# Patient Record
Sex: Female | Born: 1960
Health system: Southern US, Community
[De-identification: ages and names within clinical notes are randomized; demographics above are authoritative.]

## PROBLEM LIST (undated history)

## (undated) DIAGNOSIS — G629 Polyneuropathy, unspecified: Secondary | ICD-10-CM

## (undated) DIAGNOSIS — F32A Depression, unspecified: Secondary | ICD-10-CM

## (undated) DIAGNOSIS — E785 Hyperlipidemia, unspecified: Secondary | ICD-10-CM

## (undated) DIAGNOSIS — F419 Anxiety disorder, unspecified: Secondary | ICD-10-CM

## (undated) DIAGNOSIS — G35 Multiple sclerosis: Secondary | ICD-10-CM

## (undated) HISTORY — DX: Polyneuropathy, unspecified: G62.9

## (undated) HISTORY — PX: OTHER SURGICAL HISTORY: SHX169

## (undated) HISTORY — DX: Multiple sclerosis: G35

## (undated) HISTORY — DX: Hyperlipidemia, unspecified: E78.5

## (undated) HISTORY — PX: KNEE SURGERY: SHX244

## (undated) HISTORY — DX: Depression, unspecified: F32.A

## (undated) HISTORY — DX: Anxiety disorder, unspecified: F41.9

---

## 2011-12-23 HISTORY — PX: COLONOSCOPY: SHX174

## 2014-12-22 HISTORY — PX: CARPAL TUNNEL WITH CUBITAL TUNNEL: SHX5608

## 2020-03-15 DIAGNOSIS — E782 Mixed hyperlipidemia: Secondary | ICD-10-CM | POA: Diagnosis not present

## 2020-03-15 DIAGNOSIS — D72829 Elevated white blood cell count, unspecified: Secondary | ICD-10-CM | POA: Diagnosis not present

## 2020-03-15 DIAGNOSIS — R7301 Impaired fasting glucose: Secondary | ICD-10-CM | POA: Diagnosis not present

## 2020-03-15 DIAGNOSIS — F17200 Nicotine dependence, unspecified, uncomplicated: Secondary | ICD-10-CM | POA: Diagnosis not present

## 2020-03-15 DIAGNOSIS — Z6824 Body mass index (BMI) 24.0-24.9, adult: Secondary | ICD-10-CM | POA: Diagnosis not present

## 2020-03-15 DIAGNOSIS — Z713 Dietary counseling and surveillance: Secondary | ICD-10-CM | POA: Diagnosis not present

## 2020-03-15 DIAGNOSIS — S0006XA Insect bite (nonvenomous) of scalp, initial encounter: Secondary | ICD-10-CM | POA: Diagnosis not present

## 2020-03-15 DIAGNOSIS — E7800 Pure hypercholesterolemia, unspecified: Secondary | ICD-10-CM | POA: Diagnosis not present

## 2020-03-20 DIAGNOSIS — G4700 Insomnia, unspecified: Secondary | ICD-10-CM | POA: Diagnosis not present

## 2020-03-20 DIAGNOSIS — E782 Mixed hyperlipidemia: Secondary | ICD-10-CM | POA: Diagnosis not present

## 2020-03-20 DIAGNOSIS — G35 Multiple sclerosis: Secondary | ICD-10-CM | POA: Diagnosis not present

## 2020-03-22 ENCOUNTER — Other Ambulatory Visit (HOSPITAL_COMMUNITY): Payer: Self-pay | Admitting: Internal Medicine

## 2020-03-22 DIAGNOSIS — Z1231 Encounter for screening mammogram for malignant neoplasm of breast: Secondary | ICD-10-CM

## 2020-03-22 DIAGNOSIS — Z13820 Encounter for screening for osteoporosis: Secondary | ICD-10-CM

## 2020-04-25 DIAGNOSIS — M5415 Radiculopathy, thoracolumbar region: Secondary | ICD-10-CM | POA: Diagnosis not present

## 2020-04-26 ENCOUNTER — Other Ambulatory Visit (HOSPITAL_COMMUNITY): Payer: Self-pay | Admitting: Internal Medicine

## 2020-04-26 ENCOUNTER — Other Ambulatory Visit: Payer: Self-pay | Admitting: Internal Medicine

## 2020-04-26 DIAGNOSIS — M5415 Radiculopathy, thoracolumbar region: Secondary | ICD-10-CM

## 2020-05-04 ENCOUNTER — Other Ambulatory Visit (HOSPITAL_COMMUNITY): Payer: Self-pay | Admitting: Internal Medicine

## 2020-05-04 ENCOUNTER — Other Ambulatory Visit: Payer: Self-pay | Admitting: Adult Health Nurse Practitioner

## 2020-05-04 ENCOUNTER — Other Ambulatory Visit (HOSPITAL_COMMUNITY): Payer: Self-pay | Admitting: Adult Health Nurse Practitioner

## 2020-05-04 DIAGNOSIS — M545 Low back pain, unspecified: Secondary | ICD-10-CM

## 2020-05-04 DIAGNOSIS — M5415 Radiculopathy, thoracolumbar region: Secondary | ICD-10-CM

## 2020-05-05 ENCOUNTER — Ambulatory Visit (HOSPITAL_BASED_OUTPATIENT_CLINIC_OR_DEPARTMENT_OTHER): Payer: Medicare Other

## 2020-05-05 ENCOUNTER — Encounter (HOSPITAL_BASED_OUTPATIENT_CLINIC_OR_DEPARTMENT_OTHER): Payer: Self-pay

## 2020-05-18 ENCOUNTER — Ambulatory Visit (HOSPITAL_COMMUNITY)
Admission: RE | Admit: 2020-05-18 | Discharge: 2020-05-18 | Disposition: A | Discharge status: Home / Self Care | Payer: Medicare Other | Source: Ambulatory Visit | Attending: Internal Medicine | Admitting: Internal Medicine

## 2020-05-18 ENCOUNTER — Other Ambulatory Visit: Payer: Self-pay

## 2020-05-18 DIAGNOSIS — M545 Low back pain, unspecified: Secondary | ICD-10-CM

## 2020-05-18 DIAGNOSIS — M5415 Radiculopathy, thoracolumbar region: Secondary | ICD-10-CM | POA: Diagnosis not present

## 2020-05-18 DIAGNOSIS — M47814 Spondylosis without myelopathy or radiculopathy, thoracic region: Secondary | ICD-10-CM | POA: Diagnosis not present

## 2020-05-18 DIAGNOSIS — R079 Chest pain, unspecified: Secondary | ICD-10-CM | POA: Diagnosis not present
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
## 2020-05-18 IMAGING — MR MR CHEST MEDIASTINUM W/O CM
7 series · 16 of 16 positions shown · non-contrast
Comparison: None.

CLINICAL DATA: Left-sided posterior chest pain.

EXAM:
MRI CHEST WITHOUT CONTRAST
TECHNIQUE: Multiplanar, multiecho pulse sequences of the left posterior thorax
was performed without intravenous contrast.

[Series 4: T1 · axial · 3.0mm · 0.70mm/px · z∈[-148,+7]mm · 3 of 44 slices shown]
[im 1/44]
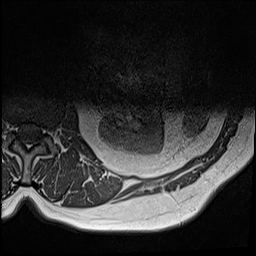
[im 22/44]
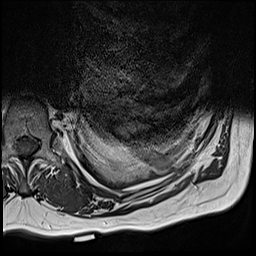
[im 44/44]
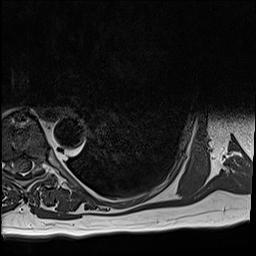

[Series 5: T1 fat-sat · axial · 3.0mm · 0.56mm/px · z∈[-148,+7]mm · 3 of 44 slices shown (1 of 2)]
[im 1/44]
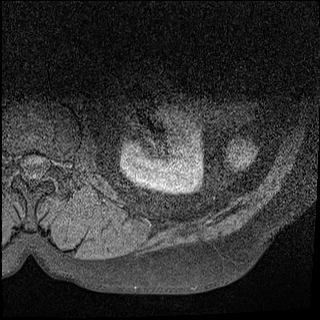
[im 22/44]
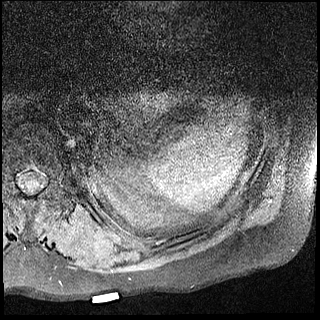
[im 44/44]
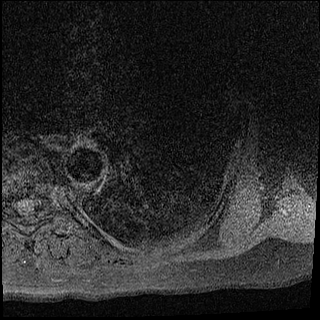

[Series 8: T1 fat-sat · coronal · 4.0mm · 0.56mm/px · 1 of 21 slices shown (2 of 2)]
[im 1/21]
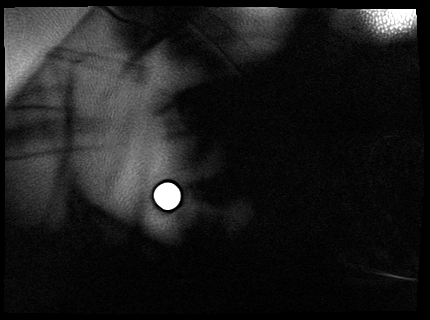

[Series 9: T2 fat-sat · coronal · 4.0mm · 0.35mm/px · 1 of 21 slices shown (1 of 2)]
[im 1/21]
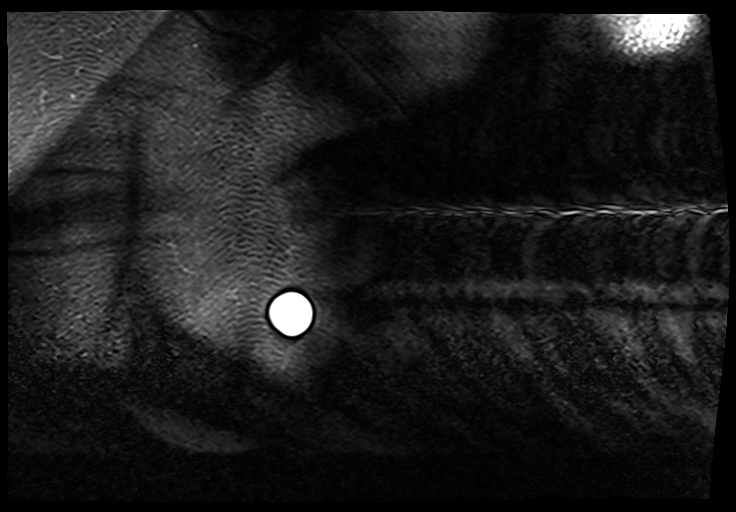

[Series 10: STIR · coronal · 3.0mm · 0.86mm/px · 2 of 26 slices shown]
[im 1/26]
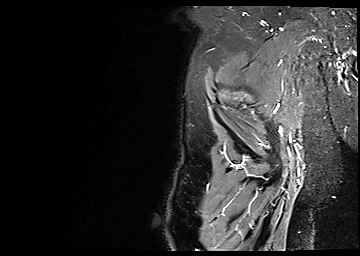
[im 26/26]
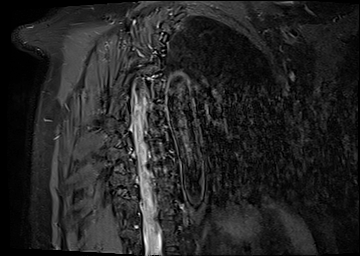

[Series 11: T2 fat-sat · axial · 3.0mm · 0.35mm/px · z∈[-148,+7]mm · 3 of 44 slices shown (2 of 2)]
[im 1/44]
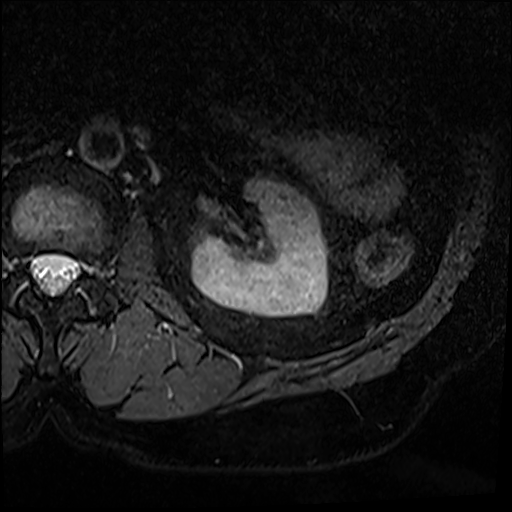
[im 22/44]
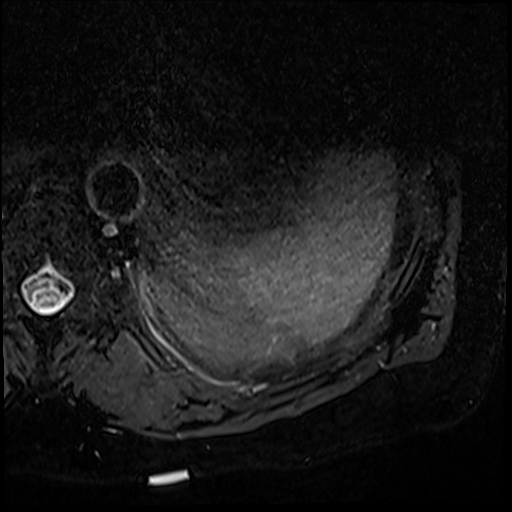
[im 44/44]
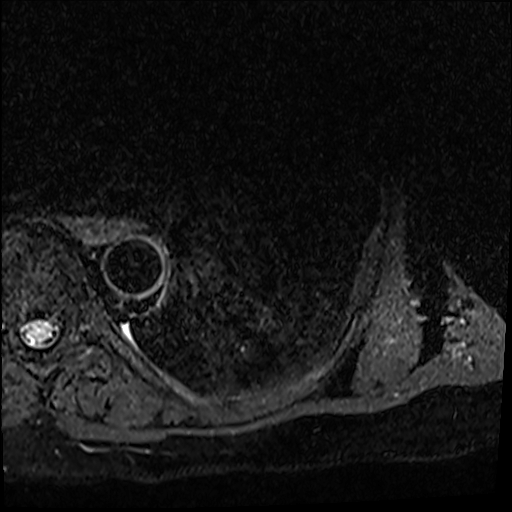

[Series 100: T2 · axial · 3.0mm · 0.56mm/px · z∈[-148,+7]mm · 3 of 44 slices shown]
[im 1/44]
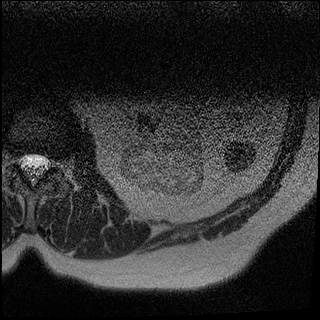
[im 22/44]
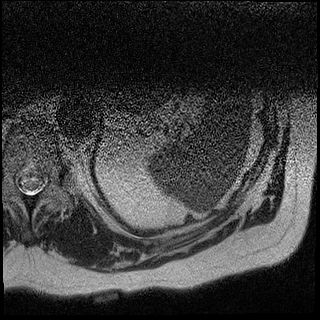
[im 44/44]
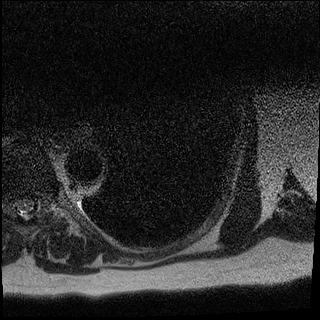

[16 of 16 positions shown; findings below may reference images not displayed]

FINDINGS: Bones/Joint/Cartilage

No marrow signal abnormality. No fracture or dislocation. Normal
alignment. No joint effusion.

Multiple old healed left posterior rib fractures.

Mild degenerative disc disease with disc height loss of the
midthoracic spine most severe at T8-9.

Ligaments, Muscles and Tendons
Muscles are normal.

Soft tissue
No fluid collection or hematoma.  No soft tissue mass.
IMPRESSION: 1. No acute abnormality of the left posterior chest.
2. Multiple old healed left posterior rib fractures.
3. Mild degenerative disc disease with disc height loss of the
midthoracic spine most severe at T8-9.
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
## 2020-05-24 DIAGNOSIS — J209 Acute bronchitis, unspecified: Secondary | ICD-10-CM | POA: Diagnosis not present

## 2020-09-05 DIAGNOSIS — M25562 Pain in left knee: Secondary | ICD-10-CM | POA: Diagnosis not present

## 2020-09-05 DIAGNOSIS — J309 Allergic rhinitis, unspecified: Secondary | ICD-10-CM | POA: Diagnosis not present

## 2020-09-05 DIAGNOSIS — M545 Low back pain: Secondary | ICD-10-CM | POA: Diagnosis not present

## 2020-09-05 DIAGNOSIS — R7301 Impaired fasting glucose: Secondary | ICD-10-CM | POA: Diagnosis not present

## 2020-09-12 DIAGNOSIS — J309 Allergic rhinitis, unspecified: Secondary | ICD-10-CM | POA: Diagnosis not present

## 2020-09-12 DIAGNOSIS — M25562 Pain in left knee: Secondary | ICD-10-CM | POA: Diagnosis not present

## 2020-09-12 DIAGNOSIS — Z0001 Encounter for general adult medical examination with abnormal findings: Secondary | ICD-10-CM | POA: Diagnosis not present

## 2020-09-12 DIAGNOSIS — M545 Low back pain: Secondary | ICD-10-CM | POA: Diagnosis not present

## 2020-10-08 ENCOUNTER — Encounter: Payer: Self-pay | Admitting: Internal Medicine

## 2020-10-22 DIAGNOSIS — H5203 Hypermetropia, bilateral: Secondary | ICD-10-CM | POA: Diagnosis not present

## 2020-10-22 DIAGNOSIS — H524 Presbyopia: Secondary | ICD-10-CM | POA: Diagnosis not present

## 2020-11-06 ENCOUNTER — Ambulatory Visit: Payer: Medicare Other | Admitting: Gastroenterology

## 2020-11-06 ENCOUNTER — Other Ambulatory Visit: Payer: Self-pay

## 2020-11-06 ENCOUNTER — Encounter: Payer: Self-pay | Admitting: Gastroenterology

## 2020-11-06 DIAGNOSIS — Z1211 Encounter for screening for malignant neoplasm of colon: Secondary | ICD-10-CM

## 2020-11-06 NOTE — Progress Notes (Signed)
Primary Care Physician:  Celene Squibb, MD  Primary Gastroenterologist:  Garfield Cornea, MD   Chief Complaint  Patient presents with  . Colonoscopy    last tcs in 2013-2014 in [place]; not having any problems    HPI:  Stacey Sawyer is a 59 y.o. female here at the request of Dr. Nevada Crane for colonoscopy.   Patient states she had colonoscopy in 2013 in [place]. She does not recall results. Her PCP at the time was Dr. Eilene Ghazi. She had colonoscopy in the [facility] system.   From gi standpoint she is doing good. BM regular. No melena, brbpr. No weight loss. No abdominal pain. Occasional heartburn. No n/v.    Current Outpatient Medications  Medication Sig Dispense Refill  . Biotin w/ Vitamins C & E (HAIR/SKIN/NAILS PO) Take 2 tablets by mouth daily.    . cyclobenzaprine (FLEXERIL) 10 MG tablet Take 10 mg by mouth daily.    Marland Kitchen escitalopram (LEXAPRO) 10 MG tablet Take 10 mg by mouth at bedtime.     . ferrous sulfate 325 (65 FE) MG tablet Take 1 tablet by mouth every other day.    . gabapentin (NEURONTIN) 600 MG tablet Take 1,200 mg by mouth 2 (two) times daily.    Marland Kitchen OVER THE COUNTER MEDICATION Calcium Magnesium Zinc Vitamin D one tablet by mouth every other day    . simvastatin (ZOCOR) 20 MG tablet Take 20 mg by mouth at bedtime.    . Teriflunomide (AUBAGIO) 14 MG TABS Take 1 tablet by mouth daily.    . traZODone (DESYREL) 50 MG tablet Take 50 mg by mouth at bedtime.     No current facility-administered medications for this visit.    Allergies as of 11/06/2020 - Review Complete 11/06/2020  Allergen Reaction Noted  . Codeine Itching 11/06/2020    Past Medical History:  Diagnosis Date  . Anxiety   . Depression   . Dyslipidemia   . MS (multiple sclerosis) ([place])   . Neuropathy     Past Surgical History:  Procedure Laterality Date  . c section     3  . CARPAL TUNNEL WITH CUBITAL TUNNEL Bilateral 2016  . COLONOSCOPY  2013   patient does not recall results  . KNEE  SURGERY Left    twice    Family History  Problem Relation Age of Onset  . Dementia Mother        deceased age 45  . Bipolar disorder Mother   . Other Father        died at age 65  . Colon cancer Neg Hx   . Colon polyps Neg Hx     Social History   Socioeconomic History  . Marital status: Married    Spouse name: Not on file  . Number of children: Not on file  . Years of education: Not on file  . Highest education level: Not on file  Occupational History  . Not on file  Tobacco Use  . Smoking status: Current Every Day Smoker    Types: Cigarettes  . Smokeless tobacco: Never Used  Substance and Sexual Activity  . Alcohol use: Not Currently  . Drug use: Never  . Sexual activity: Not on file  Other Topics Concern  . Not on file  Social History Narrative  . Not on file   Social Determinants of Health   Financial Resource Strain:   . Difficulty of Paying Living Expenses: Not on file  Food Insecurity:   . Worried About  Running Out of Food in the Last Year: Not on file  . Ran Out of Food in the Last Year: Not on file  Transportation Needs:   . Lack of Transportation (Medical): Not on file  . Lack of Transportation (Non-Medical): Not on file  Physical Activity:   . Days of Exercise per Week: Not on file  . Minutes of Exercise per Session: Not on file  Stress:   . Feeling of Stress : Not on file  Social Connections:   . Frequency of Communication with Friends and Family: Not on file  . Frequency of Social Gatherings with Friends and Family: Not on file  . Attends Religious Services: Not on file  . Active Member of Clubs or Organizations: Not on file  . Attends Archivist Meetings: Not on file  . Marital Status: Not on file  Intimate Partner Violence:   . Fear of Current or Ex-Partner: Not on file  . Emotionally Abused: Not on file  . Physically Abused: Not on file  . Sexually Abused: Not on file      ROS:  General: Negative for anorexia, weight  loss, fever, chills, fatigue, +weakness at times with MS Eyes: Negative for vision changes.  ENT: Negative for hoarseness, difficulty swallowing , nasal congestion. CV: Negative for chest pain, angina, palpitations, dyspnea on exertion, peripheral edema.  Respiratory: Negative for dyspnea at rest, dyspnea on exertion, cough, sputum, wheezing.  GI: See history of present illness. GU:  Negative for dysuria, hematuria, urinary incontinence, urinary frequency, nocturnal urination.  MS: Negative for joint pain, low back pain.  Derm: Negative for rash or itching.  Neuro: Negative for weakness, abnormal sensation, seizure, frequent headaches, memory loss, confusion.  Psych: Negative for anxiety, depression, suicidal ideation, hallucinations.  Endo: Negative for unusual weight change.  Heme: Negative for bruising or bleeding. Allergy: Negative for rash or hives.    Physical Examination:  BP (!) 151/92   Pulse 84   Temp (!) 97.1 F (36.2 C) (Temporal)   Ht '5\' 3"'  (1.6 m)   Wt 153 lb 6.4 oz (69.6 kg)   BMI 27.17 kg/m    General: Well-nourished, well-developed in no acute distress. Accompanied by spouse Head: Normocephalic, atraumatic.   Eyes: Conjunctiva pink, no icterus. Mouth:negative. Neck: Supple without thyromegaly, masses, or lymphadenopathy.  Lungs: Clear to auscultation bilaterally.  Heart: Regular rate and rhythm, no murmurs rubs or gallops.  Abdomen: Bowel sounds are normal, nontender, nondistended, no hepatosplenomegaly or masses, no abdominal bruits or    hernia , no rebound or guarding.   Rectal: not performed Extremities: No lower extremity edema. No clubbing or deformities.  Neuro: Alert and oriented x 4 , grossly normal neurologically.  Skin: Warm and dry, no rash or jaundice.   Psych: Alert and cooperative, normal mood and affect.  Labs: 09/05/2020: White blood cell count 6700, hemoglobin 13.4, platelets.  229,000, creatinine 0.9, total bilirubin 0.3, alk phos 80, AST  19, ALT 21, A1c 5.6  Imaging Studies: No results found.  Impression/Plan  59 y/o female with h/o MS, presenting to schedule colonoscopy. Her last one was in 2013, by Dr. Thurmond Butts [facility]. She is unaware of results. No FH of CRC or colon polyps to her knowledge. She denies current GI concerns. We will request records to determine timing of next colonoscopy.

## 2020-11-06 NOTE — Patient Instructions (Signed)
We will request your records and make further recommendations regarding colonoscopy.

## 2020-11-14 ENCOUNTER — Telehealth: Payer: Self-pay | Admitting: Gastroenterology

## 2020-11-14 DIAGNOSIS — Z79899 Other long term (current) drug therapy: Secondary | ICD-10-CM | POA: Diagnosis not present

## 2020-11-14 DIAGNOSIS — Z5181 Encounter for therapeutic drug level monitoring: Secondary | ICD-10-CM | POA: Diagnosis not present

## 2020-11-14 DIAGNOSIS — G35 Multiple sclerosis: Secondary | ICD-10-CM | POA: Diagnosis not present

## 2020-11-14 NOTE — Telephone Encounter (Signed)
Colonoscopy dated December 10, 2012 completed at [facility] gastroenterology specialist in [place] -Nonthrombosed external hemorrhoids found on perianal exam. -two 2 to 4 mm polyps in the rectum -one 8 mm polyp in the rectum -Internal hemorrhoids -Pathology revealed hyperplastic polyps   Dr. Gala Romney: patient presented at request of PCP for screening colonoscopy. Based on above 2013 results do you advised colonoscopy now or in 2023?

## 2020-11-18 NOTE — Telephone Encounter (Signed)
Now (2022) close enough.

## 2020-11-19 NOTE — Telephone Encounter (Signed)
Ok to schedule colonoscopy with RMR with propofol. ASA II.

## 2020-11-19 NOTE — Telephone Encounter (Signed)
LMOVM

## 2020-11-20 NOTE — Telephone Encounter (Signed)
LMOVM for pt 

## 2020-11-22 NOTE — Telephone Encounter (Signed)
LETTER MAILED TO PATIENT

## 2020-11-23 NOTE — Telephone Encounter (Signed)
Pt returned call. She has been scheduled for 1/27 at 12:00pm. covid scheduled for 1/26 at 10:00am. Aware will mail all instructions to her. Confirmed mailing address.

## 2020-11-28 ENCOUNTER — Telehealth: Payer: Self-pay | Admitting: Internal Medicine

## 2020-11-28 NOTE — Telephone Encounter (Signed)
Noted. Patient instructions would not have went out until Monday

## 2020-11-28 NOTE — Telephone Encounter (Signed)
Pt called to say she received a letter from [place] that we were unable to reach her by phone to schedule her colonoscopy. She said she was already scheduled a procedure with Dr Gala Romney on 01/17/2021. I told her to disregard the letter. She said that she has not received any instructions for the prep and wants the prep called into [facility] and doesn't want to pay for anything. [phone] (I printed off the instructions and they will go out in the mail tomorrow)

## 2020-12-04 ENCOUNTER — Telehealth: Payer: Self-pay | Admitting: *Deleted

## 2020-12-04 NOTE — Telephone Encounter (Signed)
Called pt and she was agreeable to moving procedure up on 1/27 to 10:00am. Aware will mail new prep instructions. Confirmed mailing address.

## 2021-01-16 ENCOUNTER — Other Ambulatory Visit (HOSPITAL_COMMUNITY)
Admission: RE | Admit: 2021-01-16 | Discharge: 2021-01-16 | Disposition: A | Discharge status: Home / Self Care | Payer: Medicare Other | Source: Ambulatory Visit | Attending: Internal Medicine | Admitting: Internal Medicine

## 2021-01-16 ENCOUNTER — Other Ambulatory Visit: Payer: Self-pay

## 2021-01-16 DIAGNOSIS — Z01812 Encounter for preprocedural laboratory examination: Secondary | ICD-10-CM | POA: Insufficient documentation

## 2021-01-16 DIAGNOSIS — Z20822 Contact with and (suspected) exposure to covid-19: Secondary | ICD-10-CM | POA: Insufficient documentation

## 2021-01-16 LAB — SARS CORONAVIRUS 2 (TAT 6-24 HRS): SARS Coronavirus 2: NEGATIVE

## 2021-01-17 ENCOUNTER — Encounter (HOSPITAL_COMMUNITY)
Admission: RE | Disposition: A | Discharge status: Home / Self Care | Payer: Self-pay | Source: Home / Self Care | Attending: Internal Medicine

## 2021-01-17 ENCOUNTER — Ambulatory Visit (HOSPITAL_COMMUNITY): Payer: Medicare Other | Admitting: Certified Registered Nurse Anesthetist

## 2021-01-17 ENCOUNTER — Other Ambulatory Visit: Payer: Self-pay

## 2021-01-17 ENCOUNTER — Encounter (HOSPITAL_COMMUNITY): Payer: Self-pay | Admitting: Internal Medicine

## 2021-01-17 ENCOUNTER — Ambulatory Visit (HOSPITAL_COMMUNITY)
Admission: RE | Admit: 2021-01-17 | Discharge: 2021-01-17 | Disposition: A | Discharge status: Home / Self Care | Payer: Medicare Other | Attending: Internal Medicine | Admitting: Internal Medicine

## 2021-01-17 DIAGNOSIS — Z1211 Encounter for screening for malignant neoplasm of colon: Secondary | ICD-10-CM

## 2021-01-17 DIAGNOSIS — F17210 Nicotine dependence, cigarettes, uncomplicated: Secondary | ICD-10-CM | POA: Insufficient documentation

## 2021-01-17 DIAGNOSIS — D123 Benign neoplasm of transverse colon: Secondary | ICD-10-CM | POA: Insufficient documentation

## 2021-01-17 DIAGNOSIS — K5730 Diverticulosis of large intestine without perforation or abscess without bleeding: Secondary | ICD-10-CM | POA: Diagnosis not present

## 2021-01-17 DIAGNOSIS — K635 Polyp of colon: Secondary | ICD-10-CM | POA: Diagnosis not present

## 2021-01-17 DIAGNOSIS — G35 Multiple sclerosis: Secondary | ICD-10-CM | POA: Diagnosis not present

## 2021-01-17 DIAGNOSIS — Z79899 Other long term (current) drug therapy: Secondary | ICD-10-CM | POA: Diagnosis not present

## 2021-01-17 HISTORY — PX: POLYPECTOMY: SHX5525

## 2021-01-17 HISTORY — PX: COLONOSCOPY WITH PROPOFOL: SHX5780

## 2021-01-17 SURGERY — COLONOSCOPY WITH PROPOFOL
Anesthesia: General

## 2021-01-17 MED ORDER — PROPOFOL 500 MG/50ML IV EMUL
INTRAVENOUS | Status: DC | PRN
  Administered 2021-01-17: 125 ug/kg/min via INTRAVENOUS

## 2021-01-17 MED ORDER — PROPOFOL 10 MG/ML IV BOLUS
INTRAVENOUS | Status: DC | PRN
  Administered 2021-01-17: 100 mg via INTRAVENOUS

## 2021-01-17 MED ORDER — LACTATED RINGERS IV SOLN: INTRAVENOUS | Status: DC

## 2021-01-17 NOTE — Anesthesia Postprocedure Evaluation (Signed)
Anesthesia Post Note  Patient: [name]  Procedure(s) Performed: COLONOSCOPY WITH PROPOFOL (N/A ) POLYPECTOMY  Patient location during evaluation: Phase II Anesthesia Type: General Level of consciousness: awake and oriented Pain management: satisfactory to patient Vital Signs Assessment: post-procedure vital signs reviewed and stable Respiratory status: spontaneous breathing and respiratory function stable Cardiovascular status: blood pressure returned to baseline Postop Assessment: no apparent nausea or vomiting Anesthetic complications: no   No complications documented.   Last Vitals:  Vitals:   01/17/21 0826  BP: (!) 160/89  Resp: 20  Temp: 36.9 C  SpO2: 97%    Last Pain:  Vitals:   01/17/21 1002  TempSrc:   PainSc: 0-No pain                 Karna Dupes

## 2021-01-17 NOTE — Discharge Instructions (Signed)
Colonoscopy Discharge Instructions  Read the instructions outlined below and refer to this sheet in the next few weeks. These discharge instructions provide you with general information on caring for yourself after you leave the hospital. Your doctor may also give you specific instructions. While your treatment has been planned according to the most current medical practices available, unavoidable complications occasionally occur. If you have any problems or questions after discharge, call Dr. Gala Romney at [phone]. ACTIVITY  You may resume your regular activity, but move at a slower pace for the next 24 hours.   Take frequent rest periods for the next 24 hours.   Walking will help get rid of the air and reduce the bloated feeling in your belly (abdomen).   No driving for 24 hours (because of the medicine (anesthesia) used during the test).    Do not sign any important legal documents or operate any machinery for 24 hours (because of the anesthesia used during the test).  NUTRITION  Drink plenty of fluids.   You may resume your normal diet as instructed by your doctor.   Begin with a light meal and progress to your normal diet. Heavy or fried foods are harder to digest and may make you feel sick to your stomach (nauseated).   Avoid alcoholic beverages for 24 hours or as instructed.  MEDICATIONS  You may resume your normal medications unless your doctor tells you otherwise.  WHAT YOU CAN EXPECT TODAY  Some feelings of bloating in the abdomen.   Passage of more gas than usual.   Spotting of blood in your stool or on the toilet paper.  IF YOU HAD POLYPS REMOVED DURING THE COLONOSCOPY:  No aspirin products for 7 days or as instructed.   No alcohol for 7 days or as instructed.   Eat a soft diet for the next 24 hours.  FINDING OUT THE RESULTS OF YOUR TEST Not all test results are available during your visit. If your test results are not back during the visit, make an appointment  with your caregiver to find out the results. Do not assume everything is normal if you have not heard from your caregiver or the medical facility. It is important for you to follow up on all of your test results.  SEEK IMMEDIATE MEDICAL ATTENTION IF:  You have more than a spotting of blood in your stool.   Your belly is swollen (abdominal distention).   You are nauseated or vomiting.   You have a temperature over 101.   You have abdominal pain or discomfort that is severe or gets worse throughout the day.   2 polyps removed in your colon today  Diverticulosis and colon polyp information provided  Further recommendations to follow pending review of pathology report  At patient request, I called Venancio Poisson at [phone] -rolled to voicemail.  Left a message.   Diverticulosis  Diverticulosis is a condition that develops when small pouches (diverticula) form in the wall of the large intestine (colon). The colon is where water is absorbed and stool (feces) is formed. The pouches form when the inside layer of the colon pushes through weak spots in the outer layers of the colon. You may have a few pouches or many of them. The pouches usually do not cause problems unless they become inflamed or infected. When this happens, the condition is called diverticulitis. What are the causes? The cause of this condition is not known. What increases the risk? The following factors may make you more  likely to develop this condition:  Being older than age 45. Your risk for this condition increases with age. Diverticulosis is rare among people younger than age 44. By age 57, many people have it.  Eating a low-fiber diet.  Having frequent constipation.  Being overweight.  Not getting enough exercise.  Smoking.  Taking over-the-counter pain medicines, like aspirin and ibuprofen.  Having a family history of diverticulosis. What are the signs or symptoms? In most people, there are no  symptoms of this condition. If you do have symptoms, they may include:  Bloating.  Cramps in the abdomen.  Constipation or diarrhea.  Pain in the lower left side of the abdomen. How is this diagnosed? Because diverticulosis usually has no symptoms, it is most often diagnosed during an exam for other colon problems. The condition may be diagnosed by:  Using a flexible scope to examine the colon (colonoscopy).  Taking an X-ray of the colon after dye has been put into the colon (barium enema).  Having a CT scan. How is this treated? You may not need treatment for this condition. Your health care provider may recommend treatment to prevent problems. You may need treatment if you have symptoms or if you previously had diverticulitis. Treatment may include:  Eating a high-fiber diet.  Taking a fiber supplement.  Taking a live bacteria supplement (probiotic).  Taking medicine to relax your colon.   Follow these instructions at home: Medicines  Take over-the-counter and prescription medicines only as told by your health care provider.  If told by your health care provider, take a fiber supplement or probiotic. Constipation prevention Your condition may cause constipation. To prevent or treat constipation, you may need to:  Drink enough fluid to keep your urine pale yellow.  Take over-the-counter or prescription medicines.  Eat foods that are high in fiber, such as beans, whole grains, and fresh fruits and vegetables.  Limit foods that are high in fat and processed sugars, such as fried or sweet foods.   General instructions  Try not to strain when you have a bowel movement.  Keep all follow-up visits as told by your health care provider. This is important. Contact a health care provider if you:  Have pain in your abdomen.  Have bloating.  Have cramps.  Have not had a bowel movement in 3 days. Get help right away if:  Your pain gets worse.  Your bloating becomes  very bad.  You have a fever or chills, and your symptoms suddenly get worse.  You vomit.  You have bowel movements that are bloody or black.  You have bleeding from your rectum. Summary  Diverticulosis is a condition that develops when small pouches (diverticula) form in the wall of the large intestine (colon).  You may have a few pouches or many of them.  This condition is most often diagnosed during an exam for other colon problems.  Treatment may include increasing the fiber in your diet, taking supplements, or taking medicines. This information is not intended to replace advice given to you by your health care provider. Make sure you discuss any questions you have with your health care provider. Document Revised: 07/07/2019 Document Reviewed: 07/07/2019 Elsevier Patient Education  [place].  Colon Polyps  Colon polyps are tissue growths inside the colon, which is part of the large intestine. They are one of the types of polyps that can grow in the body. A polyp may be a round bump or a mushroom-shaped growth. You could  have one polyp or more than one. Most colon polyps are noncancerous (benign). However, some colon polyps can become cancerous over time. Finding and removing the polyps early can help prevent this. What are the causes? The exact cause of colon polyps is not known. What increases the risk? The following factors may make you more likely to develop this condition:  Having a family history of colorectal cancer or colon polyps.  Being older than 59 years of age.  Being younger than 59 years of age and having a significant family history of colorectal cancer or colon polyps or a genetic condition that puts you at higher risk of getting colon polyps.  Having inflammatory bowel disease, such as ulcerative colitis or Crohn's disease.  Having certain conditions passed from parent to child (hereditary conditions), such as: ? Familial adenomatous polyposis  (FAP). ? Lynch syndrome. ? Turcot syndrome. ? Peutz-Jeghers syndrome. ? MUTYH-associated polyposis (MAP).  Being overweight.  Certain lifestyle factors. These include smoking cigarettes, drinking too much alcohol, not getting enough exercise, and eating a diet that is high in fat and red meat and low in fiber.  Having had childhood cancer that was treated with radiation of the abdomen. What are the signs or symptoms? Many times, there are no symptoms. If you have symptoms, they may include:  Blood coming from the rectum during a bowel movement.  Blood in the stool (feces). The blood may be bright red or very dark in color.  Pain in the abdomen.  A change in bowel habits, such as constipation or diarrhea. How is this diagnosed? This condition is diagnosed with a colonoscopy. This is a procedure in which a lighted, flexible scope is inserted into the opening between the buttocks (anus) and then passed into the colon to examine the area. Polyps are sometimes found when a colonoscopy is done as part of routine cancer screening tests. How is this treated? This condition is treated by removing any polyps that are found. Most polyps can be removed during a colonoscopy. Those polyps will then be tested for cancer. Additional treatment may be needed depending on the results of testing. Follow these instructions at home: Eating and drinking  Eat foods that are high in fiber, such as fruits, vegetables, and whole grains.  Eat foods that are high in calcium and vitamin D, such as milk, cheese, yogurt, eggs, liver, fish, and broccoli.  Limit foods that are high in fat, such as fried foods and desserts.  Limit the amount of red meat, precooked or cured meat, or other processed meat that you eat, such as hot dogs, sausages, bacon, or meat loaves.  Limit sugary drinks.   Lifestyle  Maintain a healthy weight, or lose weight if recommended by your health care provider.  Exercise every day or  as told by your health care provider.  Do not use any products that contain nicotine or tobacco, such as cigarettes, e-cigarettes, and chewing tobacco. If you need help quitting, ask your health care provider.  Do not drink alcohol if: ? Your health care provider tells you not to drink. ? You are pregnant, may be pregnant, or are planning to become pregnant.  If you drink alcohol: ? Limit how much you use to:  0-1 drink a day for women.  0-2 drinks a day for men. ? Know how much alcohol is in your drink. In the [place] one drink equals one 12 oz bottle of beer (355 mL), one 5 oz glass of wine (148 mL),  or one 1 oz glass of hard liquor (44 mL). General instructions  Take over-the-counter and prescription medicines only as told by your health care provider.  Keep all follow-up visits. This is important. This includes having regularly scheduled colonoscopies. Talk to your health care provider about when you need a colonoscopy. Contact a health care provider if:  You have new or worsening bleeding during a bowel movement.  You have new or increased blood in your stool.  You have a change in bowel habits.  You lose weight for no known reason. Summary  Colon polyps are tissue growths inside the colon, which is part of the large intestine. They are one type of polyp that can grow in the body.  Most colon polyps are noncancerous (benign), but some can become cancerous over time.  This condition is diagnosed with a colonoscopy.  This condition is treated by removing any polyps that are found. Most polyps can be removed during a colonoscopy. This information is not intended to replace advice given to you by your health care provider. Make sure you discuss any questions you have with your health care provider. Document Revised: 03/28/2020 Document Reviewed: 03/28/2020 Elsevier Patient Education  2021 [employer].

## 2021-01-17 NOTE — Transfer of Care (Signed)
Immediate Anesthesia Transfer of Care Note  Patient: Stacey Sawyer  Procedure(s) Performed: COLONOSCOPY WITH PROPOFOL (N/A ) POLYPECTOMY  Patient Location: PACU  Anesthesia Type:General  Level of Consciousness: awake and alert   Airway & Oxygen Therapy: Patient Spontanous Breathing  Post-op Assessment: Report given to RN and Post -op Vital signs reviewed and stable  Post vital signs: Reviewed and stable  Last Vitals:  Vitals Value Taken Time  BP    Temp    Pulse    Resp    SpO2      Last Pain:  Vitals:   01/17/21 1002  TempSrc:   PainSc: 0-No pain      Patients Stated Pain Goal: 10 (37/48/27 0786)  Complications: No complications documented.

## 2021-01-17 NOTE — H&P (Signed)
@LOGO @   Primary Care Physician:  Celene Squibb, MD Primary Gastroenterologist:  Dr. Gala Romney  Pre-Procedure History & Physical: HPI:  Stacey Sawyer is a 60 y.o. female is here for a screening colonoscopy. Negative colonoscopy in [place] 10 years ago. No GI symptoms currently.  Past Medical History:  Diagnosis Date  . Anxiety   . Depression   . Dyslipidemia   . MS (multiple sclerosis) ([place])   . Neuropathy     Past Surgical History:  Procedure Laterality Date  . c section     3  . CARPAL TUNNEL WITH CUBITAL TUNNEL Bilateral 2016  . COLONOSCOPY  2013   patient does not recall results  . KNEE SURGERY Left    twice    Prior to Admission medications   Medication Sig Start Date End Date Taking? Authorizing Provider  Biotin w/ Vitamins C & E (HAIR/SKIN/NAILS PO) Take 2 tablets by mouth 2 (two) times a week. gummie   Yes [provider]  cyclobenzaprine (FLEXERIL) 10 MG tablet Take 10 mg by mouth 3 (three) times daily.   Yes [provider]  escitalopram (LEXAPRO) 10 MG tablet Take 10 mg by mouth at bedtime.    Yes [provider]  ferrous sulfate 325 (65 FE) MG tablet Take 325 mg by mouth every other day.   Yes [provider]  gabapentin (NEURONTIN) 600 MG tablet Take 1,200 mg by mouth 2 (two) times daily. 10/19/20  Yes [provider]  OVER THE COUNTER MEDICATION Take 1 tablet by mouth every other day. Calcium Magnesium Zinc Vitamin D   Yes [provider]  simvastatin (ZOCOR) 20 MG tablet Take 20 mg by mouth at bedtime. 10/19/20  Yes [provider]  Teriflunomide (AUBAGIO) 14 MG TABS Take 14 mg by mouth daily.   Yes [provider]  traZODone (DESYREL) 50 MG tablet Take 50 mg by mouth at bedtime. 08/23/20  Yes [provider]  diazepam (VALIUM) 5 MG tablet Take 5 mg by mouth daily as needed for anxiety or sedation. 12/17/20   [provider]  oxyCODONE (OXY IR/ROXICODONE) 5 MG immediate  release tablet Take 5 mg by mouth daily as needed for pain. 09/02/14   [provider]    Allergies as of 11/23/2020 - Review Complete 11/06/2020  Allergen Reaction Noted  . Codeine Itching 11/06/2020    Family History  Problem Relation Age of Onset  . Dementia Mother        deceased age 74  . Bipolar disorder Mother   . Other Father        died at age 46  . Colon cancer Neg Hx   . Colon polyps Neg Hx     Social History   Socioeconomic History  . Marital status: Married    Spouse name: Not on file  . Number of children: Not on file  . Years of education: Not on file  . Highest education level: Not on file  Occupational History  . Not on file  Tobacco Use  . Smoking status: Current Every Day Smoker    Types: Cigarettes  . Smokeless tobacco: Never Used  Substance and Sexual Activity  . Alcohol use: Not Currently  . Drug use: Never  . Sexual activity: Not on file  Other Topics Concern  . Not on file  Social History Narrative  . Not on file   Social Determinants of Health   Financial Resource Strain: Not on file  Food Insecurity: Not on  file  Transportation Needs: Not on file  Physical Activity: Not on file  Stress: Not on file  Social Connections: Not on file  Intimate Partner Violence: Not on file    Review of Systems: See HPI, otherwise negative ROS  Physical Exam: BP (!) 160/89   Temp 98.4 F (36.9 C) (Oral)   Resp 20   Ht 5\' 3"  (1.6 m)   SpO2 97%   BMI 27.17 kg/m  General:   Alert,  Well-developed, well-nourished, pleasant and cooperative in NAD Lungs:  Clear throughout to auscultation.   No wheezes, crackles, or rhonchi. No acute distress. Heart:  Regular rate and rhythm; no murmurs, clicks, rubs,  or gallops. Abdomen:  Soft, nontender and nondistended. No masses, hepatosplenomegaly or hernias noted. Normal bowel sounds, without guarding, and without rebound.   ect.  Impression/Plan: Stacey Sawyer is now here to undergo a screening  colonoscopy. Average risk screening examination  Risks, benefits, limitations, imponderables and alternatives regarding colonoscopy have been reviewed with the patient. Questions have been answered. All parties agreeable.     Notice:  This dictation was prepared with Dragon dictation along with smaller phrase technology. Any transcriptional errors that result from this process are unintentional and may not be corrected upon review.

## 2021-01-17 NOTE — Anesthesia Preprocedure Evaluation (Signed)
Anesthesia Evaluation  Patient identified by MRN, date of birth, ID band Patient awake    Reviewed: Allergy & Precautions, H&P , NPO status , Patient's Chart, lab work & pertinent test results, reviewed documented beta blocker date and time   Airway Mallampati: II  TM Distance: >3 FB Neck ROM: full    Dental no notable dental hx. (+) Teeth Intact   Pulmonary neg pulmonary ROS, Current Smoker,    Pulmonary exam normal breath sounds clear to auscultation       Cardiovascular Exercise Tolerance: Good negative cardio ROS   Rhythm:regular Rate:Normal     Neuro/Psych PSYCHIATRIC DISORDERS Anxiety Depression negative neurological ROS     GI/Hepatic negative GI ROS, Neg liver ROS,   Endo/Other  negative endocrine ROS  Renal/GU negative Renal ROS  negative genitourinary   Musculoskeletal   Abdominal   Peds  Hematology negative hematology ROS (+)   Anesthesia Other Findings MS  Reproductive/Obstetrics negative OB ROS                             Anesthesia Physical Anesthesia Plan  ASA: III  Anesthesia Plan: General   Post-op Pain Management:    Induction:   PONV Risk Score and Plan: Propofol infusion and TIVA  Airway Management Planned:   Additional Equipment:   Intra-op Plan:   Post-operative Plan:   Informed Consent: I have reviewed the patients History and Physical, chart, labs and discussed the procedure including the risks, benefits and alternatives for the proposed anesthesia with the patient or authorized representative who has indicated his/her understanding and acceptance.     Dental Advisory Given  Plan Discussed with: CRNA  Anesthesia Plan Comments:         Anesthesia Quick Evaluation

## 2021-01-17 NOTE — Op Note (Signed)
[facility] Patient Name: Stacey Sawyer Procedure Date: 01/17/2021 9:31 AM MRN: [id number] Date of Birth: 04-Nov-1961 Attending MD: Norvel Richards , MD CSN: [id number] Age: 60 Admit Type: Outpatient Procedure:                Colonoscopy Indications:              Screening for colorectal malignant neoplasm Providers:                Norvel Richards, MD, Gwenlyn Fudge, RN,                            Kristine L. Risa Grill, Technician, Nelma Rothman,                            [employer] officer Referring MD:              Medicines:                Propofol per Anesthesia Complications:            No immediate complications. Estimated Blood Loss:     Estimated blood loss was minimal. Procedure:                Pre-Anesthesia Assessment:                           - Prior to the procedure, a History and Physical                            was performed, and patient medications and                            allergies were reviewed. The patient's tolerance of                            previous anesthesia was also reviewed. The risks                            and benefits of the procedure and the sedation                            options and risks were discussed with the patient.                            All questions were answered, and informed consent                            was obtained. Prior Anticoagulants: The patient has                            taken no previous anticoagulant or antiplatelet                            agents. ASA Grade Assessment: II - A patient with  mild systemic disease. After reviewing the risks                            and benefits, the patient was deemed in                            satisfactory condition to undergo the procedure.                           After obtaining informed consent, the colonoscope                            was passed under direct vision. Throughout the                            procedure, the  patient's blood pressure, pulse, and                            oxygen saturations were monitored continuously. The                            [id number]-HQ190L ([id number]) scope was introduced through                            the anus and advanced to the the cecum, identified                            by appendiceal orifice and ileocecal valve. The                            colonoscopy was performed without difficulty. The                            patient tolerated the procedure well. The quality                            of the bowel preparation was adequate. The                            ileocecal valve, appendiceal orifice, and rectum                            were photographed. The entire colon was examined. Scope In: 10:07:43 AM Scope Out: 10:21:01 AM Scope Withdrawal Time: 0 hours 9 minutes 15 seconds  Total Procedure Duration: 0 hours 13 minutes 18 seconds  Findings:      The perianal and digital rectal examinations were normal.      Scattered medium-mouthed diverticula were found in the sigmoid colon.      Two sessile polyps were found in the splenic flexure. The polyps were 3       to 6 mm in size. These polyps were removed with a cold snare. Resection       and retrieval were complete. Estimated blood loss was minimal.      The exam was otherwise without abnormality on  direct and retroflexion       views. Impression:               - Diverticulosis in the sigmoid colon.                           - Two 3 to 6 mm polyps at the splenic flexure,                            removed with a cold snare. Resected and retrieved.                           - The examination was otherwise normal on direct                            and retroflexion views. Moderate Sedation:      Moderate (conscious) sedation was personally administered by an       anesthesia professional. The following parameters were monitored: oxygen       saturation, heart rate, blood pressure, respiratory rate, EKG,  adequacy       of pulmonary ventilation, and response to care. Recommendation:           - Patient has a contact number available for                            emergencies. The signs and symptoms of potential                            delayed complications were discussed with the                            patient. Return to normal activities tomorrow.                            Written discharge instructions were provided to the                            patient.                           - Resume previous diet.                           - Continue present medications.                           - Repeat colonoscopy date to be determined after                            pending pathology results are reviewed for                            surveillance.                           - Return to GI office (date not yet determined). Procedure Code(s):        ---  Professional ---                           [id number], Colonoscopy, flexible; with removal of                            tumor(s), polyp(s), or other lesion(s) by snare                            technique Diagnosis Code(s):        --- Professional ---                           Z12.11, Encounter for screening for malignant                            neoplasm of colon                           K63.5, Polyp of colon                           K57.30, Diverticulosis of large intestine without                            perforation or abscess without bleeding CPT copyright 2019 American Medical Association. All rights reserved. The codes documented in this report are preliminary and upon coder review may  be revised to meet current compliance requirements. Cristopher Estimable. Thao Bauza, MD Norvel Richards, MD 01/17/2021 10:29:50 AM This report has been signed electronically. Number of Addenda: 0

## 2021-01-18 ENCOUNTER — Encounter: Payer: Self-pay | Admitting: Internal Medicine

## 2021-01-18 LAB — SURGICAL PATHOLOGY

## 2021-01-21 ENCOUNTER — Encounter (HOSPITAL_COMMUNITY): Payer: Self-pay | Admitting: Internal Medicine

## 2021-03-20 ENCOUNTER — Other Ambulatory Visit: Payer: Self-pay

## 2021-03-20 ENCOUNTER — Ambulatory Visit (HOSPITAL_COMMUNITY)
Admission: RE | Admit: 2021-03-20 | Discharge: 2021-03-20 | Disposition: A | Discharge status: Home / Self Care | Payer: Medicare Other | Source: Ambulatory Visit | Attending: Internal Medicine | Admitting: Internal Medicine

## 2021-03-20 DIAGNOSIS — Z780 Asymptomatic menopausal state: Secondary | ICD-10-CM | POA: Insufficient documentation

## 2021-03-20 DIAGNOSIS — M8589 Other specified disorders of bone density and structure, multiple sites: Secondary | ICD-10-CM | POA: Insufficient documentation

## 2021-03-20 DIAGNOSIS — Z13820 Encounter for screening for osteoporosis: Secondary | ICD-10-CM | POA: Diagnosis not present

## 2021-03-20 DIAGNOSIS — G35 Multiple sclerosis: Secondary | ICD-10-CM | POA: Diagnosis not present

## 2021-03-20 DIAGNOSIS — F17200 Nicotine dependence, unspecified, uncomplicated: Secondary | ICD-10-CM | POA: Insufficient documentation

## 2021-03-20 DIAGNOSIS — Z1231 Encounter for screening mammogram for malignant neoplasm of breast: Secondary | ICD-10-CM | POA: Insufficient documentation

## 2021-03-20 IMAGING — MG MM DIGITAL SCREENING BILAT W/ TOMO AND CAD
8 series · 8 of 24 positions shown · non-contrast
Comparison: Previous exam(s).

CLINICAL DATA: Screening.

EXAM:
DIGITAL SCREENING BILATERAL MAMMOGRAM WITH TOMOSYNTHESIS AND CAD
TECHNIQUE: Bilateral screening digital craniocaudal and mediolateral oblique
mammograms were obtained. Bilateral screening digital breast
tomosynthesis was performed. The images were evaluated with
computer-aided detection.

[R CC synth-2D]
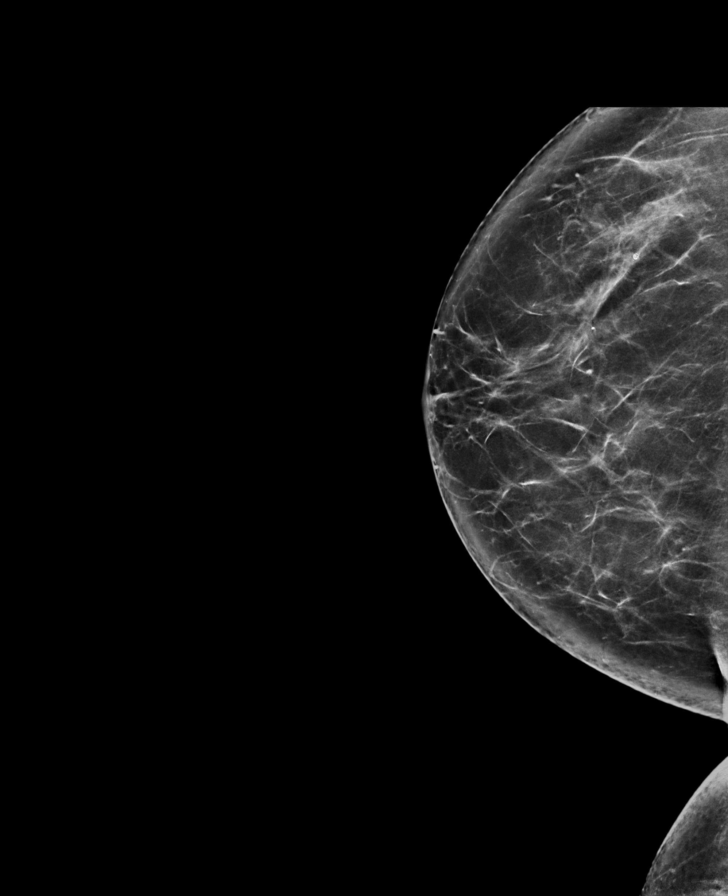

[L MLO synth-2D]
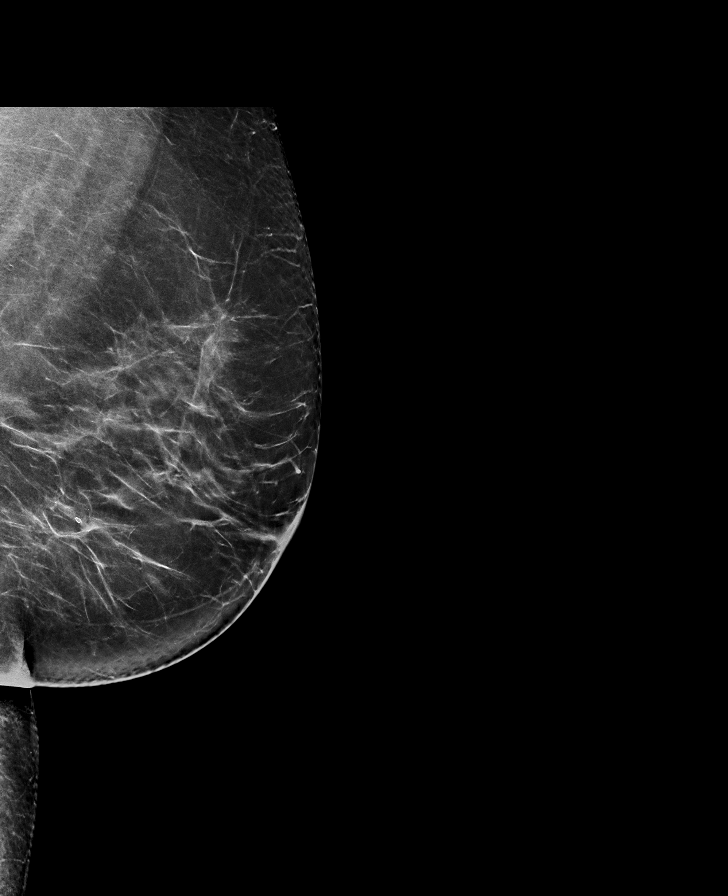

[L CC synth-2D]
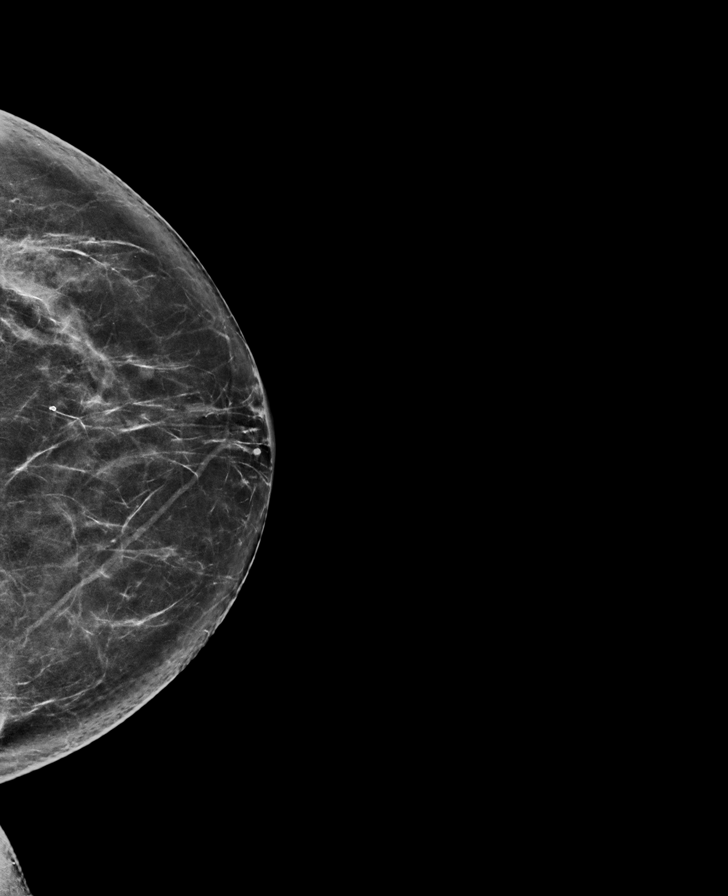

[R MLO synth-2D]
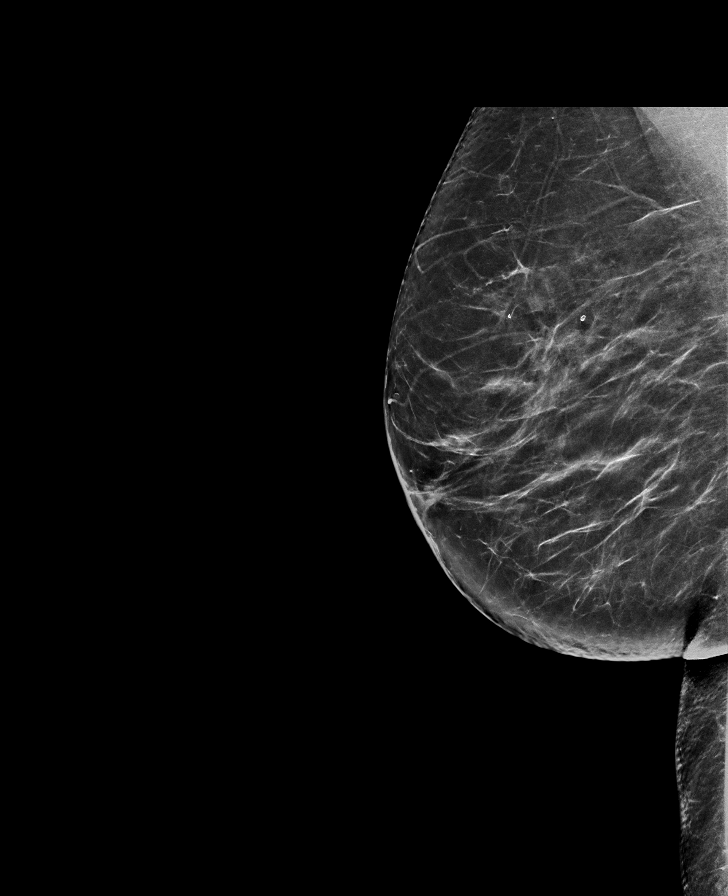

[L CC tomo · tomo slice 37/73.0]
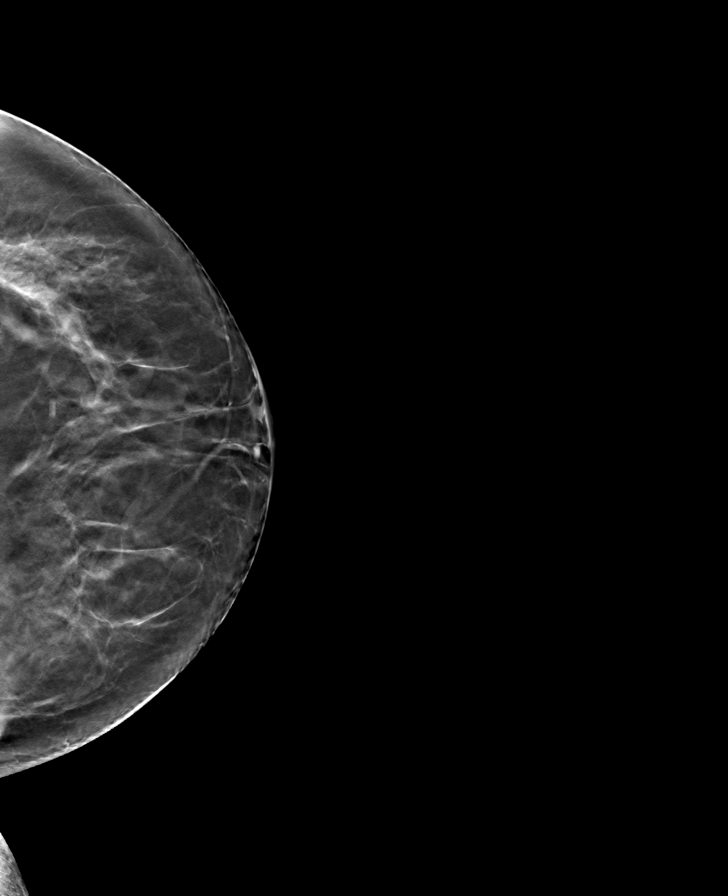

[R MLO tomo · tomo slice 40/79.0]
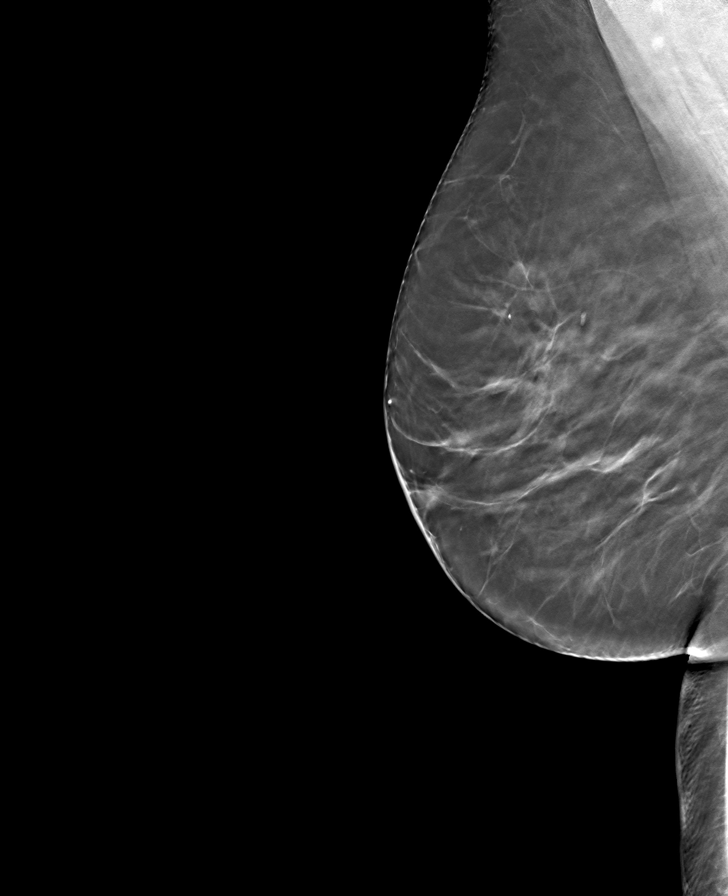

[L MLO tomo · tomo slice 43/84.0]
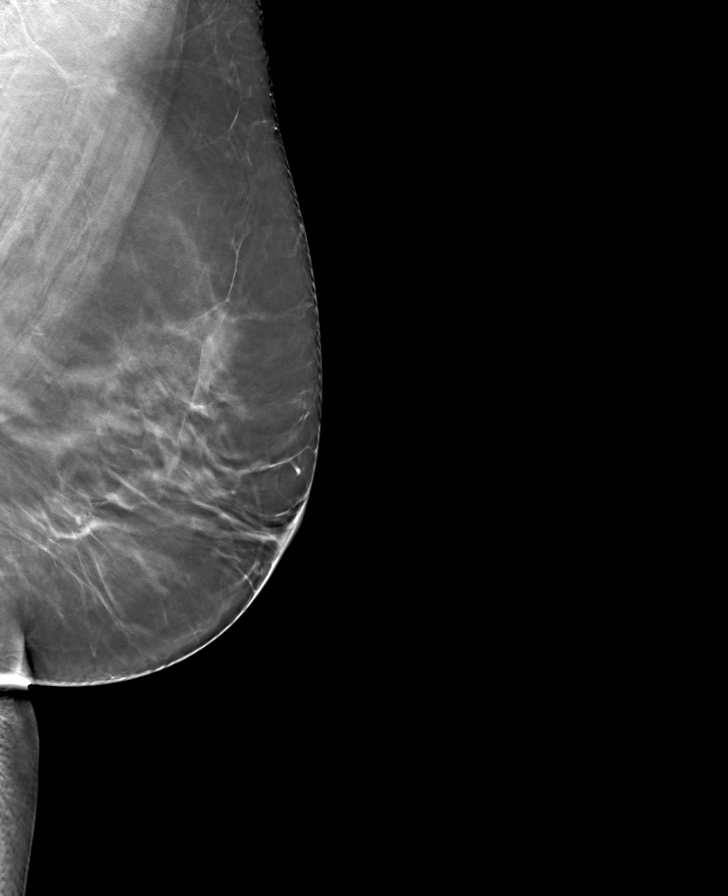

[R CC tomo · tomo slice 41/81.0]
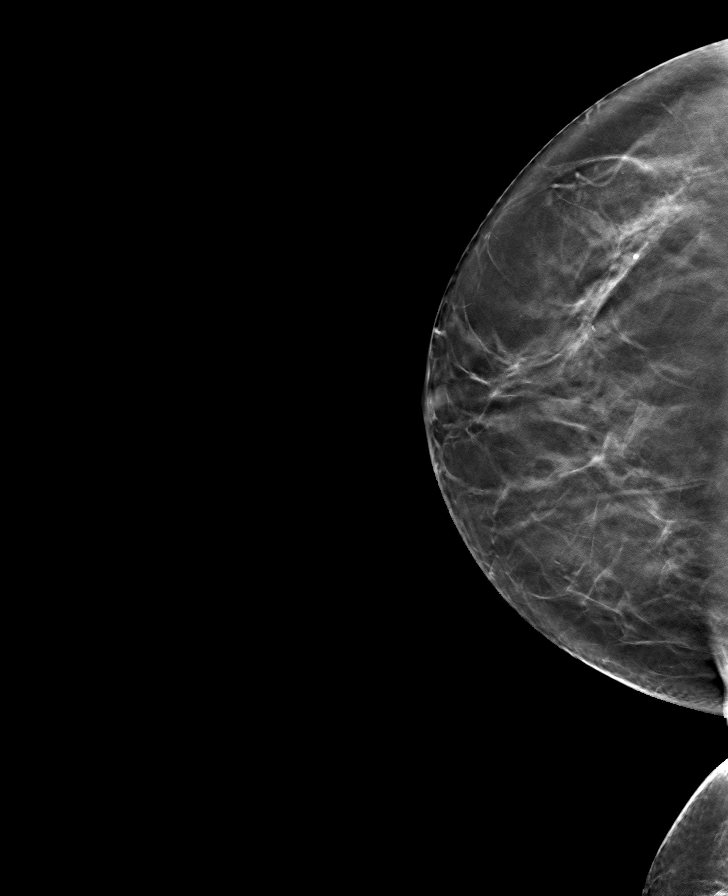

[8 of 24 positions shown; findings below may reference images not displayed]

ACR Breast Density Category b: There are scattered areas of
fibroglandular density.
FINDINGS: There are no findings suspicious for malignancy. The images were
evaluated with computer-aided detection.
IMPRESSION: No mammographic evidence of malignancy. A result letter of this
screening mammogram will be mailed directly to the patient.

RECOMMENDATION:
Screening mammogram in one year. (Code:[OD])

BI-RADS CATEGORY  1: Negative.

## 2021-04-03 ENCOUNTER — Inpatient Hospital Stay
Admission: RE | Admit: 2021-04-03 | Discharge: 2021-04-03 | Disposition: A | Discharge status: Home / Self Care | Payer: Self-pay | Source: Ambulatory Visit | Attending: Internal Medicine | Admitting: Internal Medicine

## 2021-04-03 ENCOUNTER — Other Ambulatory Visit (HOSPITAL_COMMUNITY): Payer: Self-pay | Admitting: Internal Medicine

## 2021-04-03 DIAGNOSIS — Z1231 Encounter for screening mammogram for malignant neoplasm of breast: Secondary | ICD-10-CM

## 2022-05-21 ENCOUNTER — Emergency Department (HOSPITAL_BASED_OUTPATIENT_CLINIC_OR_DEPARTMENT_OTHER)
Admission: EM | Admit: 2022-05-21 | Discharge: 2022-05-21 | Disposition: A | Discharge status: Home / Self Care | Payer: Medicare Other | Attending: Emergency Medicine | Admitting: Emergency Medicine

## 2022-05-21 ENCOUNTER — Encounter (HOSPITAL_BASED_OUTPATIENT_CLINIC_OR_DEPARTMENT_OTHER): Payer: Self-pay

## 2022-05-21 ENCOUNTER — Other Ambulatory Visit: Payer: Self-pay

## 2022-05-21 ENCOUNTER — Emergency Department (HOSPITAL_BASED_OUTPATIENT_CLINIC_OR_DEPARTMENT_OTHER): Payer: Medicare Other

## 2022-05-21 DIAGNOSIS — W19XXXA Unspecified fall, initial encounter: Secondary | ICD-10-CM | POA: Diagnosis not present

## 2022-05-21 DIAGNOSIS — R55 Syncope and collapse: Secondary | ICD-10-CM | POA: Diagnosis not present

## 2022-05-21 DIAGNOSIS — R0602 Shortness of breath: Secondary | ICD-10-CM | POA: Diagnosis not present

## 2022-05-21 DIAGNOSIS — S0990XA Unspecified injury of head, initial encounter: Secondary | ICD-10-CM | POA: Diagnosis present

## 2022-05-21 DIAGNOSIS — R0781 Pleurodynia: Secondary | ICD-10-CM | POA: Diagnosis not present

## 2022-05-21 DIAGNOSIS — S0083XA Contusion of other part of head, initial encounter: Secondary | ICD-10-CM | POA: Insufficient documentation

## 2022-05-21 LAB — CBC
HCT: 33.8 % — ABNORMAL LOW (ref 36.0–46.0)
Hemoglobin: 11.3 g/dL — ABNORMAL LOW (ref 12.0–15.0)
MCH: 31.8 pg (ref 26.0–34.0)
MCHC: 33.4 g/dL (ref 30.0–36.0)
MCV: 95.2 fL (ref 80.0–100.0)
Platelets: 223 10*3/uL (ref 150–400)
RBC: 3.55 MIL/uL — ABNORMAL LOW (ref 3.87–5.11)
RDW: 12.6 % (ref 11.5–15.5)
WBC: 10.6 10*3/uL — ABNORMAL HIGH (ref 4.0–10.5)
nRBC: 0 % (ref 0.0–0.2)

## 2022-05-21 LAB — BASIC METABOLIC PANEL
Anion gap: 8 (ref 5–15)
BUN: 21 mg/dL — ABNORMAL HIGH (ref 6–20)
CO2: 23 mmol/L (ref 22–32)
Calcium: 9.3 mg/dL (ref 8.9–10.3)
Chloride: 105 mmol/L (ref 98–111)
Creatinine, Ser: 0.85 mg/dL (ref 0.44–1.00)
GFR, Estimated: >60 mL/min (ref >60)
Glucose, Bld: 110 mg/dL — ABNORMAL HIGH (ref 70–99)
Potassium: 3.8 mmol/L (ref 3.5–5.1)
Sodium: 136 mmol/L (ref 135–145)

## 2022-05-21 LAB — URINALYSIS, ROUTINE W REFLEX MICROSCOPIC
Bilirubin Urine: NEGATIVE
Glucose, UA: NEGATIVE mg/dL
Hgb urine dipstick: NEGATIVE
Ketones, ur: NEGATIVE mg/dL
Leukocytes,Ua: NEGATIVE
Nitrite: NEGATIVE
Protein, ur: NEGATIVE mg/dL
Specific Gravity, Urine: 1.025 (ref 1.005–1.030)
pH: 5 (ref 5.0–8.0)

## 2022-05-21 IMAGING — CT CT HEAD W/O CM
3 series · 14 of 47 positions shown, 16 images · non-contrast
Comparison: None Available.

CLINICAL DATA: Poly trauma, blunt



[Series 2: head wo · axial · 0.42mm/px · z∈[+884,+1020]mm · 8 of 33 slices shown, 10 images]
[im 3/33  brain]
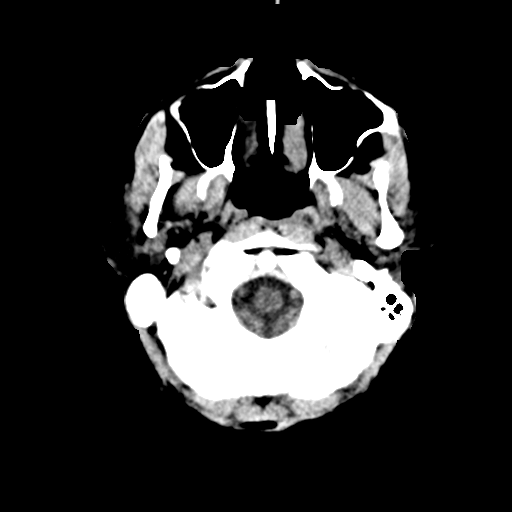
[im 3/33  bone]
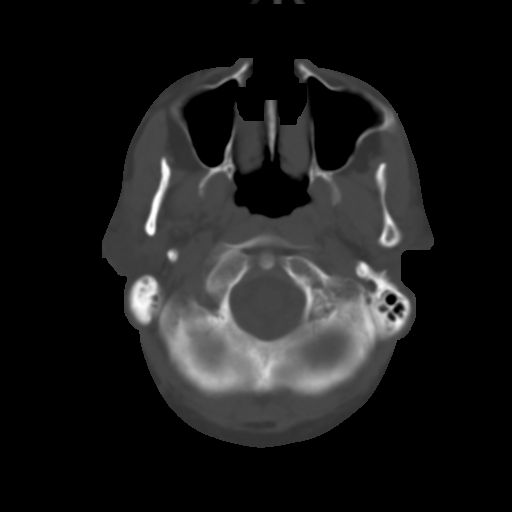
[im 7/33  brain]
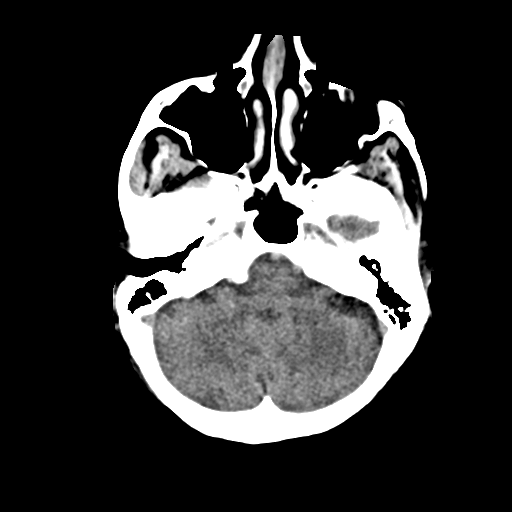
[im 10/33  brain]
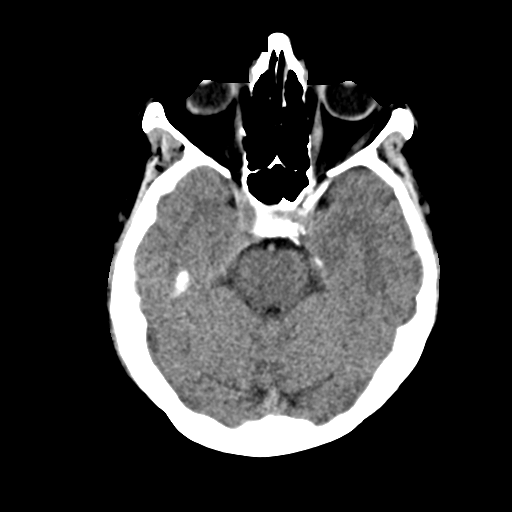
[im 15/33  brain]
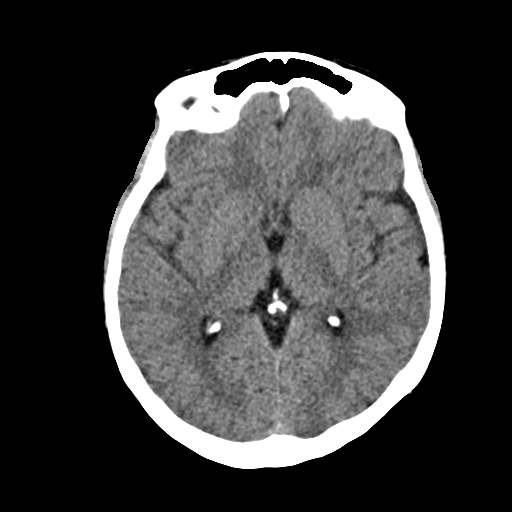
[im 18/33  brain]
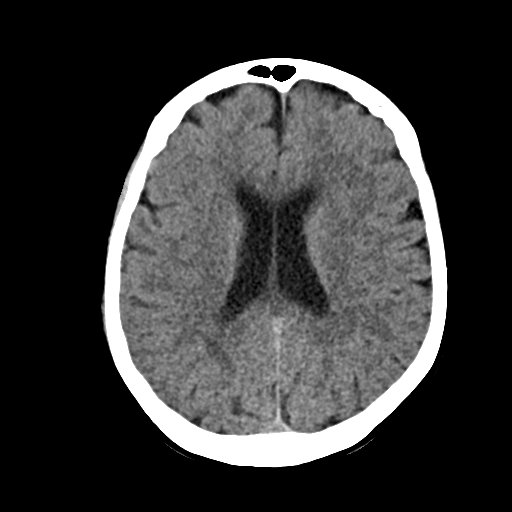
[im 18/33  bone]
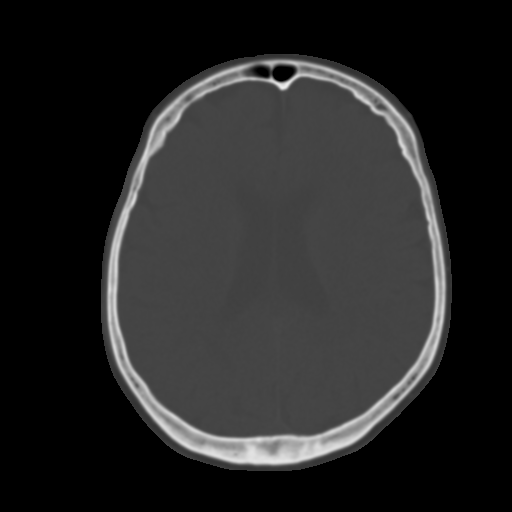
[im 23/33  brain]
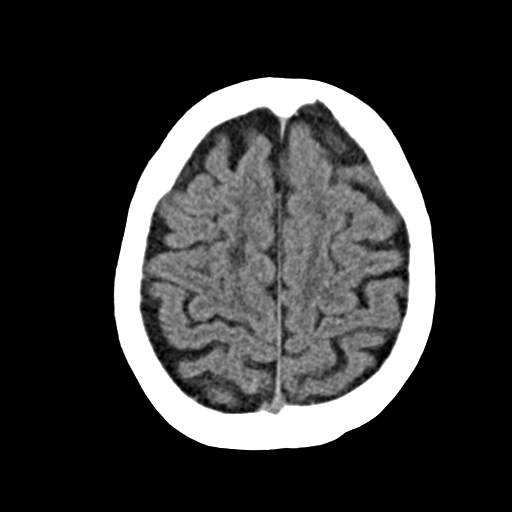
[im 26/33  brain]
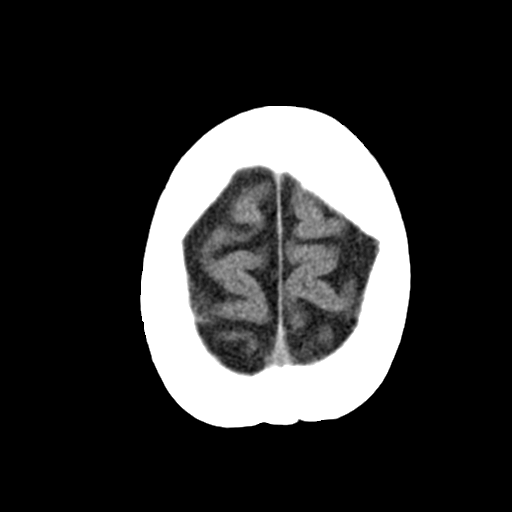
[im 30/33  brain]
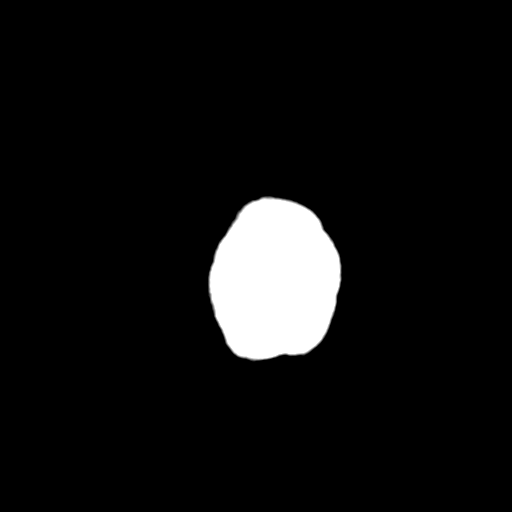

[Series 4: coronal soft · coronal · 0.31mm/px · 3 of 70 slices shown]
[im 24/70  brain]
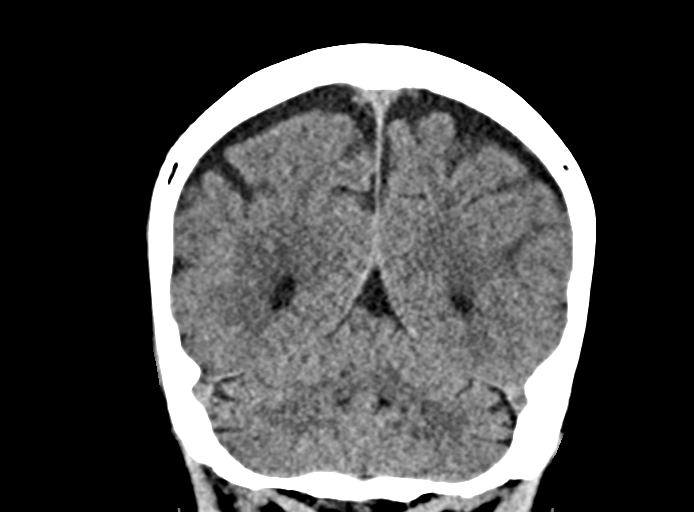
[im 31/70  brain]
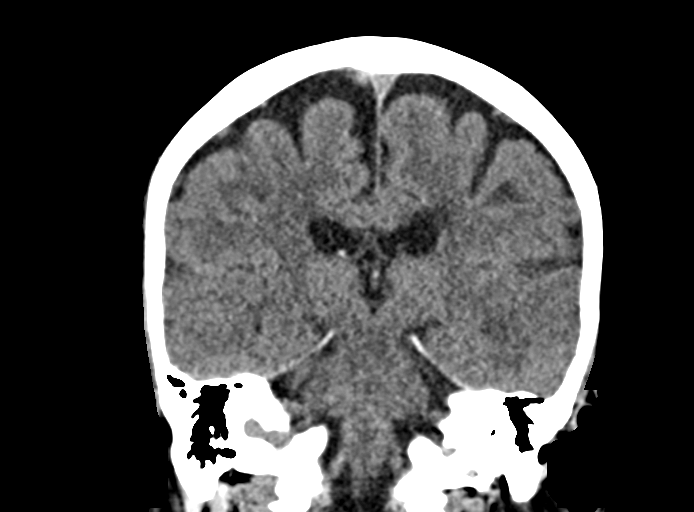
[im 39/70  brain]
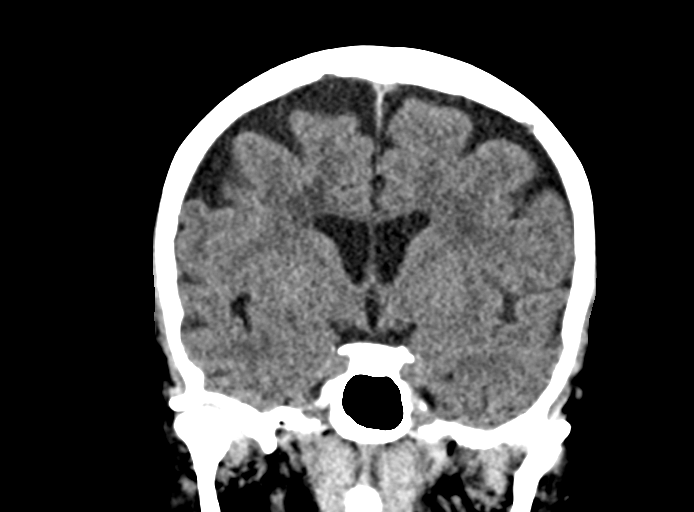

[Series 5: sag soft · sagittal · 0.31mm/px · 3 of 67 slices shown]
[im 23/67  brain]
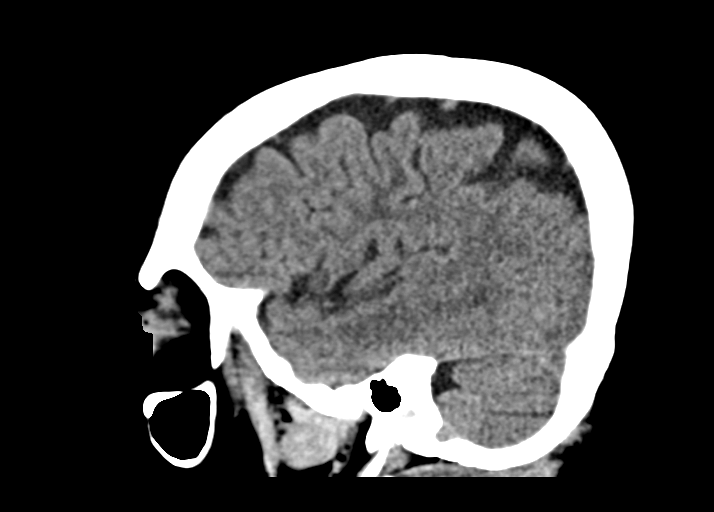
[im 34/67  brain]
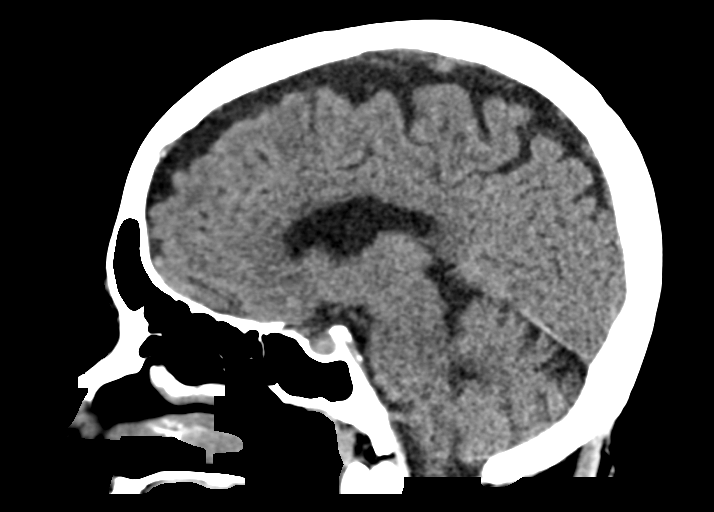
[im 45/67  brain]
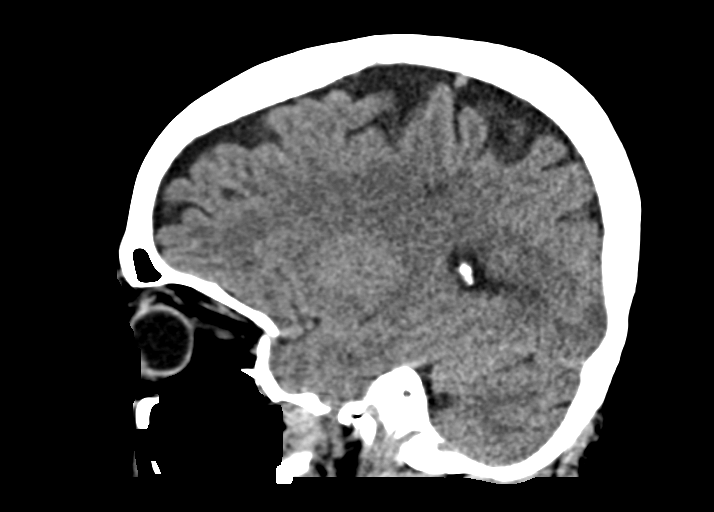

[14 of 47 positions shown; findings below may reference images not displayed]

FINDINGS: Brain: No evidence of acute intracranial hemorrhage or extra-axial
collection.No evidence of mass lesion/concerning mass effect.The
ventricles are normal in size.Scattered subcortical and
periventricular white matter hypodensities, nonspecific but likely
sequela of chronic small vessel ischemic disease.

Vascular: No hyperdense vessel or unexpected calcification.

Skull: Normal. Negative for fracture or focal lesion.

Sinuses/Orbits: No acute finding.

Other: None.
IMPRESSION: No acute intracranial abnormality. Sequela of chronic small vessel
ischemic disease.

## 2022-05-21 MED ORDER — OXYCODONE-ACETAMINOPHEN 5-325 MG PO TABS
1.0000 | ORAL_TABLET | Freq: Once | ORAL | Status: AC
  Administered 2022-05-21: 1 via ORAL
  Filled 2022-05-21: qty 1

## 2022-05-21 MED ORDER — METHOCARBAMOL 500 MG PO TABS
500.0000 mg | ORAL_TABLET | Freq: Once | ORAL | Status: AC
  Administered 2022-05-21: 500 mg via ORAL
  Filled 2022-05-21: qty 1

## 2022-05-21 MED ORDER — METHOCARBAMOL 500 MG PO TABS: 500.0000 mg | ORAL_TABLET | Freq: Two times a day (BID) | ORAL | 0 refills | Status: DC

## 2022-05-21 MED ORDER — METHOCARBAMOL 500 MG PO TABS: 500.0000 mg | ORAL_TABLET | Freq: Two times a day (BID) | ORAL | 0 refills | Status: AC

## 2022-05-21 NOTE — ED Triage Notes (Signed)
Pt presents with injuries from a syncopal fall that happened on 5/29. Pt stats she was walking out to her car when she started to feel her vision go out and then she woke up on the ground and started vomiting. Pt tried to wait to see her PCP today, but the pain became to severe. Pt reports hematoma to R side of her head. Severe pain to L chest wall and sternum. pain to R arm, hand. Pain to L thumb.

## 2022-05-21 NOTE — ED Provider Notes (Signed)
[facility] Provider Note   CSN: [id number] Arrival date & time: 05/21/22  1130     History  Chief Complaint  Patient presents with   Lytle Michaels    Stacey Sawyer is a 61 y.o. female who presents to the emergency department complaining of syncope and fall on 5/29.  Patient states that she has had a previous history of syncope.  She was going outside, when she felt faint.  She leaned against the car, next and she knew she woke up on the ground.  She had difficulty getting up on her own.  She was eventually able to get a hold of her husband, who came outside help her.  Before getting up patient had overwhelming sense of nausea, and vomited.  Husband reports that once he got her up, he watched her for the rest of the day to make sure she was acting normally.  He reports she seemed like herself and they agreed that she did not need to be seen at that time.  Since then she has had worsening right-sided rib pain, and states it feels similarly to when she has broken ribs in the past.  She also complaining of headache, with "goose egg" on the right side of her head.  She denies any chest pain or dizziness prior to the syncopal episode.   Fall Associated symptoms include headaches and shortness of breath. Pertinent negatives include no chest pain.      Home Medications Prior to Admission medications   Medication Sig Start Date End Date Taking? Authorizing Provider  Biotin w/ Vitamins C & E (HAIR/SKIN/NAILS PO) Take 2 tablets by mouth 2 (two) times a week. gummie    [provider]  cyclobenzaprine (FLEXERIL) 10 MG tablet Take 10 mg by mouth 3 (three) times daily.    [provider]  diazepam (VALIUM) 5 MG tablet Take 5 mg by mouth daily as needed for anxiety or sedation. 12/17/20   [provider]  escitalopram (LEXAPRO) 10 MG tablet Take 10 mg by mouth at bedtime.     [provider]  ferrous sulfate 325 (65 FE) MG tablet Take 325 mg by  mouth every other day.    [provider]  gabapentin (NEURONTIN) 600 MG tablet Take 1,200 mg by mouth 2 (two) times daily. 10/19/20   [provider]  methocarbamol (ROBAXIN) 500 MG tablet Take 1 tablet (500 mg total) by mouth 2 (two) times daily. 05/21/22   Linzy Laury T, PA-C  OVER THE COUNTER MEDICATION Take 1 tablet by mouth every other day. Calcium Magnesium Zinc Vitamin D    [provider]  oxyCODONE (OXY IR/ROXICODONE) 5 MG immediate release tablet Take 5 mg by mouth daily as needed for pain. 09/02/14   [provider]  simvastatin (ZOCOR) 20 MG tablet Take 20 mg by mouth at bedtime. 10/19/20   [provider]  Teriflunomide (AUBAGIO) 14 MG TABS Take 14 mg by mouth daily.    [provider]  traZODone (DESYREL) 50 MG tablet Take 50 mg by mouth at bedtime. 08/23/20   [provider]      Allergies    Codeine, Baclofen, Hydrocodone, Hydrocodone-acetaminophen, and Oxycodone    Review of Systems   Review of Systems  Constitutional:  Negative for fatigue.  Respiratory:  Positive for shortness of breath.        Right sided chest wall pain  Cardiovascular:  Negative for chest pain, palpitations and leg swelling.  Neurological:  Positive for syncope and headaches. Negative for dizziness, weakness, light-headedness and numbness.  All other systems reviewed and are negative.  Physical Exam Updated Vital Signs BP (!) 144/83   Pulse (!) 57   Temp 98 F (36.7 C) (Oral)   Resp 16   Ht '5\' 3"'$  (1.6 m)   Wt 61.1 kg   SpO2 98%   BMI 23.84 kg/m  Physical Exam Vitals and nursing note reviewed.  Constitutional:      Appearance: Normal appearance.  HENT:     Head: Normocephalic.     Comments: Hematoma noted over right temporoparietal region, without break in the skin Eyes:     Conjunctiva/sclera: Conjunctivae normal.  Cardiovascular:     Rate and Rhythm: Normal rate and regular rhythm.  Pulmonary:     Effort: Pulmonary  effort is normal. No respiratory distress.     Breath sounds: Normal breath sounds.  Chest:     Comments: Reproducible tenderness to palpation of right anterolateral chest wall, also pleuritic Abdominal:     General: There is no distension.     Palpations: Abdomen is soft.     Tenderness: There is no abdominal tenderness.  Skin:    General: Skin is warm and dry.  Neurological:     General: No focal deficit present.     Mental Status: She is alert.    ED Results / Procedures / Treatments   Labs (all labs ordered are listed, but only abnormal results are displayed) Labs Reviewed  BASIC METABOLIC PANEL - Abnormal; Notable for the following components:      Result Value   Glucose, Bld 110 (*)    BUN 21 (*)    All other components within normal limits  CBC - Abnormal; Notable for the following components:   WBC 10.6 (*)    RBC 3.55 (*)    Hemoglobin 11.3 (*)    HCT 33.8 (*)    All other components within normal limits  URINALYSIS, ROUTINE W REFLEX MICROSCOPIC  CBG MONITORING, ED    EKG None  Radiology DG Ribs Unilateral W/Chest Right  Result Date: 05/21/2022 CLINICAL DATA:  Fall, right lower anterior rib EXAM: RIGHT RIBS AND CHEST - 3+ VIEW COMPARISON:  None Available. FINDINGS: Cardiomediastinal silhouette is within normal limits. There is no focal airspace disease. No pleural fusion. No pneumothorax. There is no acute osseous abnormality. Specifically, there is no evidence of displaced rib fracture. IMPRESSION: No evidence of displaced rib fracture. No acute cardiopulmonary disease. Electronically Signed   By: Maurine Simmering M.D.   On: 05/21/2022 13:10   CT Head Wo Contrast  Result Date: 05/21/2022 CLINICAL DATA:  Poly trauma, blunt EXAM: CT HEAD WITHOUT CONTRAST TECHNIQUE: Contiguous axial images were obtained from the base of the skull through the vertex without intravenous contrast. RADIATION DOSE REDUCTION: This exam was performed according to the departmental  dose-optimization program which includes automated exposure control, adjustment of the mA and/or kV according to patient size and/or use of iterative reconstruction technique. COMPARISON:  None Available. FINDINGS: Brain: No evidence of acute intracranial hemorrhage or extra-axial collection.No evidence of mass lesion/concerning mass effect.The ventricles are normal in size.Scattered subcortical and periventricular white matter hypodensities, nonspecific but likely sequela of chronic small vessel ischemic disease. Vascular: No hyperdense vessel or unexpected calcification. Skull: Normal. Negative for fracture or focal lesion. Sinuses/Orbits: No acute finding. Other: None. IMPRESSION: No acute intracranial abnormality. Sequela of chronic small vessel ischemic disease. Electronically Signed   By: Ileene Patrick.D.  On: 05/21/2022 12:45    Procedures Procedures    Medications Ordered in ED Medications  oxyCODONE-acetaminophen (PERCOCET/ROXICET) 5-325 MG per tablet 1 tablet (1 tablet Oral Given 05/21/22 1226)  methocarbamol (ROBAXIN) tablet 500 mg (500 mg Oral Given 05/21/22 1226)    ED Course/ Medical Decision Making/ A&P                           Medical Decision Making Amount and/or Complexity of Data Reviewed Labs: ordered. Radiology: ordered.  Risk Prescription drug management.  This patient is a 61 y.o. female who presents to the ED for concern of syncope and fall, this involves an extensive number of treatment options, and is a complaint that carries with it a high risk of complications and morbidity. The emergent differential diagnosis prior to evaluation includes, but is not limited to,  CVA, ACS, arrhythmia, vasovagal syncope, orthostatic hypotension, sepsis, hypoglycemia, electrolyte disturbance, respiratory failure, symptomatic anemia, dehydration, heat injury, polypharmacy, malignancy, anxiety/panic attack. This is not an exhaustive differential.   Past Medical History /  Co-morbidities / Social History: Anxiety, depression, MS, neuropathy, HLD  Physical Exam: Physical exam performed. The pertinent findings include: Slightly hypertensive, otherwise normal vital signs.  Patient appears clinically well.  Has to halt her breathing with deep breaths due to pain in the right chest wall.  Reproducible tenderness palpation of the right anterior lateral chest wall.  Lab Tests: I ordered, and personally interpreted labs.  The pertinent results include: No significant leukocytosis.  Hemoglobin 11.3.  Electrolytes grossly within normal limits.  Urinalysis negative for hematuria or infection.   Imaging Studies: I ordered imaging studies including CT head and right unilateral chest x-ray. I independently visualized and interpreted imaging which showed no acute intracranial abnormalities, no rib fractures.. I agree with the radiologist interpretation.   Cardiac Monitoring:  The patient was maintained on a cardiac monitor.  My attending physician Dr. Gilford Raid viewed and interpreted the cardiac monitored which showed an underlying rhythm of: sinus rhythm. I agree with this interpretation.   Medications: I ordered medication including Percocet and Robaxin for right-sided chest pain and muscle spasms. Reevaluation of the patient after these medicines showed that the patient improved. I have reviewed the patients home medicines and have made adjustments as needed.  Disposition: After consideration of the diagnostic results and the patients response to treatment, I feel that patient's not requiring admission.  Discussed that we have not found an acute etiology for her syncopal episode several days ago.  I suspect it could be related to orthostatic hypotension as she had just woken up.  Although she does have a history of similar episodes, I strongly recommended that she follow-up with her primary doctor.  She did have PCP appointment today, but because it had to be rescheduled she  came to the ER.  She plans to update them about her ER visit, and schedule follow-up.  I have low suspicion for other etiology as she is clinically well-appearing, with normal vital signs and a reassuring physical exam. We discussed reasons return to the emergency department, and patient is agreeable to the plan.  Final Clinical Impression(s) / ED Diagnoses Final diagnoses:  Fall, initial encounter  Rib pain on right side  Syncope, unspecified syncope type    Rx / DC Orders ED Discharge Orders          Ordered    methocarbamol (ROBAXIN) 500 MG tablet  2 times daily,   Status:  Discontinued  05/21/22 1337    methocarbamol (ROBAXIN) 500 MG tablet  2 times daily        05/21/22 1344           Portions of this report may have been transcribed using voice recognition software. Every effort was made to ensure accuracy; however, inadvertent computerized transcription errors may be present.    Estill Cotta 05/21/22 1639    Isla Pence, MD 05/22/22 [phone]

## 2022-05-21 NOTE — ED Notes (Signed)
Pt aware of need for urine sample. Does not have to go right now

## 2022-05-21 NOTE — ED Notes (Signed)
ED Provider at bedside to review results 

## 2022-05-21 NOTE — Discharge Instructions (Addendum)
You were seen in the emergency department today after fall.  As we discussed the images of your head and your ribs were normal.  I think you likely sustained a Deis amount of bruising.  You can continue taking your pain medication at home, and I prescribed you a muscle relaxer to help with the muscle spasms.  I recommend following up with your primary doctor about your syncopal episode, and potentially lower blood pressure medication.
